# Patient Record
Sex: Male | Born: 1955 | Race: Black or African American | Hispanic: No | Marital: Married | Smoking: Never smoker
Health system: Southern US, Community
[De-identification: ages and names within clinical notes are randomized; demographics above are authoritative.]

## PROBLEM LIST (undated history)

## (undated) DIAGNOSIS — K219 Gastro-esophageal reflux disease without esophagitis: Secondary | ICD-10-CM

## (undated) DIAGNOSIS — I10 Essential (primary) hypertension: Secondary | ICD-10-CM

## (undated) DIAGNOSIS — M1990 Unspecified osteoarthritis, unspecified site: Secondary | ICD-10-CM

## (undated) HISTORY — PX: REPAIR ANKLE LIGAMENT: SUR1187

---

## 2018-12-06 ENCOUNTER — Other Ambulatory Visit: Payer: Self-pay | Admitting: Orthopaedic Surgery

## 2019-01-08 NOTE — Patient Instructions (Signed)
Brett Buchanan  01/08/2019   Your procedure is scheduled on: 01-16-19    Report to [facility] Main  Entrance    Report to SHORT STAY at 5:30 AM    Call this number if you have problems the morning of surgery [phone]    Remember: Do not eat food or drink liquids :After Midnight.    BRUSH YOUR TEETH MORNING OF SURGERY AND RINSE YOUR MOUTH OUT, NO CHEWING GUM CANDY OR MINTS.     Take these medicines the morning of surgery with A SIP OF WATER: Amlodipine (Norvasc), and Pantoprazole (Protonix)                                You may not have any metal on your body including hair pins and              piercings  Do not wear jewelry, cologne, lotions, powders or perfumes, deodorant             Men may shave face and neck.   Do not bring valuables to the hospital. [facility] IS NOT             RESPONSIBLE   FOR VALUABLES.  Contacts, dentures or bridgework may not be worn into surgery.  Leave suitcase in the car. After surgery it may be brought to your room.     Patients discharged the day of surgery will not be allowed to drive home. IF YOU ARE HAVING SURGERY AND GOING HOME THE SAME DAY, YOU MUST HAVE AN ADULT TO DRIVE YOU HOME AND BE WITH YOU FOR 24 HOURS. YOU MAY GO HOME BY TAXI OR UBER OR ORTHERWISE, BUT AN ADULT MUST ACCOMPANY YOU HOME AND STAY WITH YOU FOR 24 HOURS.    Special Instructions: N/A              Please read over the following fact sheets you were given: _____________________________________________________________________  [facility] - Preparing for Surgery         Before surgery, you can play an important role.  Because skin is not sterile, your skin needs to be as free of germs as possible.  You can reduce the number of germs on your skin by washing with CHG (chlorahexidine gluconate) soap before surgery.  CHG is an antiseptic cleaner which kills germs and bonds with the skin to continue killing germs even after washing. Please DO NOT use  if you have an allergy to CHG or antibacterial soaps.  If your skin becomes reddened/irritated stop using the CHG and inform your nurse when you arrive at Short Stay. Do not shave (including legs and underarms) for at least 48 hours prior to the first CHG shower.  You may shave your face/neck. Please follow these instructions carefully:  1.  Shower with CHG Soap the night before surgery and the  morning of Surgery.  2.  If you choose to wash your hair, wash your hair first as usual with your  normal  shampoo.  3.  After you shampoo, rinse your hair and body thoroughly to remove the  shampoo.                           4.  Use CHG as you would any other liquid soap.  You can apply chg  directly  to the skin and wash                       Gently with a scrungie or clean washcloth.  5.  Apply the CHG Soap to your body ONLY FROM THE NECK DOWN.   Do not use on face/ open                           Wound or open sores. Avoid contact with eyes, ears mouth and genitals (private parts).                       Wash face,  Genitals (private parts) with your normal soap.             6.  Wash thoroughly, paying special attention to the area where your surgery  will be performed.  7.  Thoroughly rinse your body with warm water from the neck down.  8.  DO NOT shower/wash with your normal soap after using and rinsing off  the CHG Soap.                9.  Pat yourself dry with a clean towel.            10.  Wear clean pajamas.            11.  Place clean sheets on your bed the night of your first shower and do not  sleep with pets. Day of Surgery : Do not apply any lotions/deodorants the morning of surgery.  Please wear clean clothes to the hospital/surgery center.  FAILURE TO FOLLOW THESE INSTRUCTIONS MAY RESULT IN THE CANCELLATION OF YOUR SURGERY PATIENT SIGNATURE_________________________________  NURSE  SIGNATURE__________________________________  ________________________________________________________________________   Brett Buchanan  An incentive spirometer is a tool that can help keep your lungs clear and active. This tool measures how well you are filling your lungs with each breath. Taking long deep breaths may help reverse or decrease the chance of developing breathing (pulmonary) problems (especially infection) following:  A long period of time when you are unable to move or be active. BEFORE THE PROCEDURE   If the spirometer includes an indicator to show your best effort, your nurse or respiratory therapist will set it to a desired goal.  If possible, sit up straight or lean slightly forward. Try not to slouch.  Hold the incentive spirometer in an upright position. INSTRUCTIONS FOR USE  1. Sit on the edge of your bed if possible, or sit up as far as you can in bed or on a chair. 2. Hold the incentive spirometer in an upright position. 3. Breathe out normally. 4. Place the mouthpiece in your mouth and seal your lips tightly around it. 5. Breathe in slowly and as deeply as possible, raising the piston or the ball toward the top of the column. 6. Hold your breath for 3-5 seconds or for as long as possible. Allow the piston or ball to fall to the bottom of the column. 7. Remove the mouthpiece from your mouth and breathe out normally. 8. Rest for a few seconds and repeat Steps 1 through 7 at least 10 times every 1-2 hours when you are awake. Take your time and take a few normal breaths between deep breaths. 9. The spirometer may include an indicator to show your best effort. Use the indicator as a goal to work toward during each repetition. 10.  After each set of 10 deep breaths, practice coughing to be sure your lungs are clear. If you have an incision (the cut made at the time of surgery), support your incision when coughing by placing a pillow or rolled up towels firmly  against it. Once you are able to get out of bed, walk around indoors and cough well. You may stop using the incentive spirometer when instructed by your caregiver.  RISKS AND COMPLICATIONS  Take your time so you do not get dizzy or light-headed.  If you are in pain, you may need to take or ask for pain medication before doing incentive spirometry. It is harder to take a deep breath if you are having pain. AFTER USE  Rest and breathe slowly and easily.  It can be helpful to keep track of a log of your progress. Your caregiver can provide you with a simple table to help with this. If you are using the spirometer at home, follow these instructions: [place] IF:   You are having difficultly using the spirometer.  You have trouble using the spirometer as often as instructed.  Your pain medication is not giving enough relief while using the spirometer.  You develop fever of 100.5 F (38.1 C) or higher. SEEK IMMEDIATE MEDICAL CARE IF:   You cough up bloody sputum that had not been present before.  You develop fever of 102 F (38.9 C) or greater.  You develop worsening pain at or near the incision site. MAKE SURE YOU:   Understand these instructions.  Will watch your condition.  Will get help right away if you are not doing well or get worse. Document Released: 02/28/2007 Document Revised: 01/10/2012 Document Reviewed: 05/01/2007 [employer] Patient Information 2014 [employer], [place].   ________________________________________________________________________  WHAT IS A BLOOD TRANSFUSION? Blood Transfusion Information  A transfusion is the replacement of blood or some of its parts. Blood is made up of multiple cells which provide different functions.  Red blood cells carry oxygen and are used for blood loss replacement.  White blood cells fight against infection.  Platelets control bleeding.  Plasma helps clot blood.  Other blood products are available for  specialized needs, such as hemophilia or other clotting disorders. BEFORE THE TRANSFUSION  Who gives blood for transfusions?   Healthy volunteers who are fully evaluated to make sure their blood is safe. This is blood bank blood. Transfusion therapy is the safest it has ever been in the practice of medicine. Before blood is taken from a donor, a complete history is taken to make sure that person has no history of diseases nor engages in risky social behavior (examples are intravenous drug use or sexual activity with multiple partners). The donor's travel history is screened to minimize risk of transmitting infections, such as malaria. The donated blood is tested for signs of infectious diseases, such as HIV and hepatitis. The blood is then tested to be sure it is compatible with you in order to minimize the chance of a transfusion reaction. If you or a relative donates blood, this is often done in anticipation of surgery and is not appropriate for emergency situations. It takes many days to process the donated blood. RISKS AND COMPLICATIONS Although transfusion therapy is very safe and saves many lives, the main dangers of transfusion include:   Getting an infectious disease.  Developing a transfusion reaction. This is an allergic reaction to something in the blood you were given. Every precaution is taken to prevent this. The decision  to have a blood transfusion has been considered carefully by your caregiver before blood is given. Blood is not given unless the benefits outweigh the risks. AFTER THE TRANSFUSION  Right after receiving a blood transfusion, you will usually feel much better and more energetic. This is especially true if your red blood cells have gotten low (anemic). The transfusion raises the level of the red blood cells which carry oxygen, and this usually causes an energy increase.  The nurse administering the transfusion will monitor you carefully for complications. HOME CARE  INSTRUCTIONS  No special instructions are needed after a transfusion. You may find your energy is better. Speak with your caregiver about any limitations on activity for underlying diseases you may have. SEEK MEDICAL CARE IF:   Your condition is not improving after your transfusion.  You develop redness or irritation at the intravenous (IV) site. SEEK IMMEDIATE MEDICAL CARE IF:  Any of the following symptoms occur over the next 12 hours:  Shaking chills.  You have a temperature by mouth above 102 F (38.9 C), not controlled by medicine.  Chest, back, or muscle pain.  People around you feel you are not acting correctly or are confused.  Shortness of breath or difficulty breathing.  Dizziness and fainting.  You get a rash or develop hives.  You have a decrease in urine output.  Your urine turns a dark color or changes to pink, red, or brown. Any of the following symptoms occur over the next 10 days:  You have a temperature by mouth above 102 F (38.9 C), not controlled by medicine.  Shortness of breath.  Weakness after normal activity.  The white part of the eye turns yellow (jaundice).  You have a decrease in the amount of urine or are urinating less often.  Your urine turns a dark color or changes to pink, red, or brown. Document Released: 10/15/2000 Document Revised: 01/10/2012 Document Reviewed: 06/03/2008 [facility] Patient Information 2014 [place], [place].  _______________________________________________________________________

## 2019-01-10 ENCOUNTER — Encounter (HOSPITAL_COMMUNITY)
Admission: RE | Admit: 2019-01-10 | Discharge: 2019-01-10 | Disposition: A | Discharge status: Home / Self Care | Payer: No Typology Code available for payment source | Source: Ambulatory Visit | Attending: Orthopaedic Surgery | Admitting: Orthopaedic Surgery

## 2019-01-10 ENCOUNTER — Encounter (HOSPITAL_COMMUNITY): Payer: Self-pay

## 2019-01-10 ENCOUNTER — Ambulatory Visit (HOSPITAL_COMMUNITY)
Admission: RE | Admit: 2019-01-10 | Discharge: 2019-01-10 | Disposition: A | Discharge status: Home / Self Care | Payer: No Typology Code available for payment source | Source: Ambulatory Visit | Attending: Orthopaedic Surgery | Admitting: Orthopaedic Surgery

## 2019-01-10 ENCOUNTER — Other Ambulatory Visit: Payer: Self-pay

## 2019-01-10 DIAGNOSIS — Z01818 Encounter for other preprocedural examination: Secondary | ICD-10-CM | POA: Insufficient documentation

## 2019-01-10 HISTORY — DX: Unspecified osteoarthritis, unspecified site: M19.90

## 2019-01-10 HISTORY — DX: Essential (primary) hypertension: I10

## 2019-01-10 HISTORY — DX: Gastro-esophageal reflux disease without esophagitis: K21.9

## 2019-01-10 LAB — CBC WITH DIFFERENTIAL/PLATELET
Abs Immature Granulocytes: 0.02 10*3/uL (ref 0.00–0.07)
Basophils Absolute: 0.1 10*3/uL (ref 0.0–0.1)
Basophils Relative: 2 %
Eosinophils Absolute: 0.4 10*3/uL (ref 0.0–0.5)
Eosinophils Relative: 6 %
HCT: 44.9 % (ref 39.0–52.0)
HEMOGLOBIN: 14.6 g/dL (ref 13.0–17.0)
Immature Granulocytes: 0 %
Lymphocytes Relative: 35 %
Lymphs Abs: 2.4 10*3/uL (ref 0.7–4.0)
MCH: 29 pg (ref 26.0–34.0)
MCHC: 32.5 g/dL (ref 30.0–36.0)
MCV: 89.3 fL (ref 80.0–100.0)
MONOS PCT: 12 %
Monocytes Absolute: 0.9 10*3/uL (ref 0.1–1.0)
Neutro Abs: 3.2 10*3/uL (ref 1.7–7.7)
Neutrophils Relative %: 45 %
Platelets: 294 10*3/uL (ref 150–400)
RBC: 5.03 MIL/uL (ref 4.22–5.81)
RDW: 12.9 % (ref 11.5–15.5)
WBC: 7 10*3/uL (ref 4.0–10.5)
nRBC: 0 % (ref 0.0–0.2)

## 2019-01-10 LAB — URINALYSIS, ROUTINE W REFLEX MICROSCOPIC
Bilirubin Urine: NEGATIVE
Glucose, UA: NEGATIVE mg/dL
Hgb urine dipstick: NEGATIVE
Ketones, ur: NEGATIVE mg/dL
Leukocytes,Ua: NEGATIVE
NITRITE: NEGATIVE
Protein, ur: NEGATIVE mg/dL
Specific Gravity, Urine: 1.008 (ref 1.005–1.030)
pH: 6 (ref 5.0–8.0)

## 2019-01-10 LAB — BASIC METABOLIC PANEL
Anion gap: 9 (ref 5–15)
BUN: 16 mg/dL (ref 8–23)
CO2: 25 mmol/L (ref 22–32)
Calcium: 9.2 mg/dL (ref 8.9–10.3)
Chloride: 103 mmol/L (ref 98–111)
Creatinine, Ser: 0.94 mg/dL (ref 0.61–1.24)
GFR calc Af Amer: >60 mL/min (ref >60)
GFR calc non Af Amer: >60 mL/min (ref >60)
Glucose, Bld: 90 mg/dL (ref 70–99)
Potassium: 3.8 mmol/L (ref 3.5–5.1)
Sodium: 137 mmol/L (ref 135–145)

## 2019-01-10 LAB — PROTIME-INR
INR: 1 (ref 0.8–1.2)
Prothrombin Time: 12.9 seconds (ref 11.4–15.2)

## 2019-01-10 LAB — SURGICAL PCR SCREEN
MRSA, PCR: NEGATIVE
STAPHYLOCOCCUS AUREUS: NEGATIVE

## 2019-01-10 LAB — APTT: aPTT: 30 seconds (ref 24–36)

## 2019-01-10 NOTE — H&P (Signed)
TOTAL KNEE ADMISSION H&P  Patient is being admitted for right total knee arthroplasty.  Subjective:  Chief Complaint:right knee pain.  HPI: Brett Buchanan, 63 y.o. male, has a history of pain and functional disability in the right knee due to arthritis and has failed non-surgical conservative treatments for greater than 12 weeks to includeNSAID's and/or analgesics, corticosteriod injections, flexibility and strengthening excercises, supervised PT with diminished ADL's post treatment, use of assistive devices and weight reduction as appropriate.  Onset of symptoms was gradual, starting 1 years ago with gradually worsening course since that time. The patient noted prior procedures on the knee to include  arthroscopy on the right knee(s).  Patient currently rates pain in the right knee(s) at 10 out of 10 with activity. Patient has night pain, worsening of pain with activity and weight bearing, pain that interferes with activities of daily living, crepitus and joint swelling.  Patient has evidence of subchondral cysts, subchondral sclerosis, periarticular osteophytes and joint space narrowing by imaging studies. There is no active infection.  There are no active problems to display for this patient.  No past medical history on file.   No current facility-administered medications for this encounter.    Current Outpatient Medications  Medication Sig Dispense Refill Last Dose  . amLODipine (NORVASC) 10 MG tablet Take 10 mg by mouth daily.     Marland Kitchen aspirin EC 81 MG tablet Take 81 mg by mouth daily.     . cholecalciferol (VITAMIN D3) 25 MCG (1000 UT) tablet Take 1,000 Units by mouth daily.     . hydrochlorothiazide (HYDRODIURIL) 25 MG tablet Take 25 mg by mouth daily.     . irbesartan (AVAPRO) 300 MG tablet Take 300 mg by mouth daily.     . pantoprazole (PROTONIX) 20 MG tablet Take 20 mg by mouth daily.      No Known Allergies  Social History   Tobacco Use  . Smoking status: Not on file  Substance Use  Topics  . Alcohol use: Not on file    No family history on file.   Review of Systems  Musculoskeletal: Positive for joint pain.       Right knee  All other systems reviewed and are negative.   Objective:  Physical Exam  Constitutional: He is oriented to person, place, and time. He appears well-developed and well-nourished.  HENT:  Head: Normocephalic and atraumatic.  Eyes: Pupils are equal, round, and reactive to light. Conjunctivae are normal.  Neck: Normal range of motion.  Cardiovascular: Normal rate and regular rhythm.  Respiratory: Effort normal.  GI: Soft.  Musculoskeletal:     Comments: Right knee motion is about 0-120.  He has medial joint line pain and crepitation.  His skin is benign.  Hip motion is full and straight leg raise is negative.  Sensation and motor function are intact in his feet with palpable pulses on both sides.   Neurological: He is alert and oriented to person, place, and time.  Skin: Skin is warm and dry.  Psychiatric: He has a normal mood and affect. His behavior is normal. Judgment and thought content normal.    Vital signs in last 24 hours: BP: ()/()  Arterial Line BP: ()/()   Labs:   There is no height or weight on file to calculate BMI.   Imaging Review Plain radiographs demonstrate severe degenerative joint disease of the right knee(s). The overall alignment isneutral. The bone quality appears to be good for age and reported activity level.  Assessment/Plan:  End stage arthritis, right knee   The patient history, physical examination, clinical judgment of the provider and imaging studies are consistent with end stage degenerative joint disease of the right knee(s) and total knee arthroplasty is deemed medically necessary. The treatment options including medical management, injection therapy arthroscopy and arthroplasty were discussed at length. The risks and benefits of total knee arthroplasty were presented and reviewed. The  risks due to aseptic loosening, infection, stiffness, patella tracking problems, thromboembolic complications and other imponderables were discussed. The patient acknowledged the explanation, agreed to proceed with the plan and consent was signed. Patient is being admitted for inpatient treatment for surgery, pain control, PT, OT, prophylactic antibiotics, VTE prophylaxis, progressive ambulation and ADL's and discharge planning. The patient is planning to be discharged home with home health services  Patient's anticipated LOS is less than 2 midnights, meeting these requirements: - Younger than 19 - Lives within 1 hour of care - Has a competent adult at home to recover with post-op recover - NO history of  - Chronic pain requiring opiods  - Diabetes  - Coronary Artery Disease  - Heart failure  - Heart attack  - Stroke  - DVT/VTE  - Cardiac arrhythmia  - Respiratory Failure/COPD  - Renal failure  - Anemia  - Advanced Liver disease

## 2019-01-11 LAB — ABO/RH: ABO/RH(D): O POS

## 2019-01-15 MED ORDER — BUPIVACAINE LIPOSOME 1.3 % IJ SUSP
20.0000 mL | Freq: Once | INTRAMUSCULAR | Status: DC
  Filled 2019-01-15: qty 20

## 2019-01-15 MED ORDER — TRANEXAMIC ACID 1000 MG/10ML IV SOLN
2000.0000 mg | INTRAVENOUS | Status: DC
  Filled 2019-01-15: qty 20

## 2019-01-15 NOTE — Anesthesia Preprocedure Evaluation (Addendum)
Anesthesia Evaluation  Patient identified by MRN, date of birth, ID band Patient awake    Reviewed: Allergy & Precautions, NPO status , Patient's Chart, lab work & pertinent test results  Airway Mallampati: I  TM Distance: >3 FB Neck ROM: Full    Dental no notable dental hx. (+) Teeth Intact, Dental Advisory Given   Pulmonary neg pulmonary ROS,    Pulmonary exam normal breath sounds clear to auscultation       Cardiovascular hypertension, Pt. on medications negative cardio ROS Normal cardiovascular exam Rhythm:Regular Rate:Normal     Neuro/Psych negative neurological ROS  negative psych ROS   GI/Hepatic Neg liver ROS, GERD  Medicated,  Endo/Other  negative endocrine ROS  Renal/GU negative Renal ROS  negative genitourinary   Musculoskeletal  (+) Arthritis , Osteoarthritis,    Abdominal   Peds  Hematology negative hematology ROS (+)   Anesthesia Other Findings   Reproductive/Obstetrics                            Anesthesia Physical Anesthesia Plan  ASA: II  Anesthesia Plan: Spinal and Regional   Post-op Pain Management:  Regional for Post-op pain   Induction:   PONV Risk Score and Plan: Treatment may vary due to age or medical condition, Midazolam, Ondansetron and Dexamethasone  Airway Management Planned: Natural Airway and Simple Face Mask  Additional Equipment:   Intra-op Plan:   Post-operative Plan:   Informed Consent: I have reviewed the patients History and Physical, chart, labs and discussed the procedure including the risks, benefits and alternatives for the proposed anesthesia with the patient or authorized representative who has indicated his/her understanding and acceptance.     Dental advisory given  Plan Discussed with: CRNA  Anesthesia Plan Comments:         Anesthesia Quick Evaluation

## 2019-01-16 ENCOUNTER — Observation Stay (HOSPITAL_COMMUNITY)
Admission: RE | Admit: 2019-01-16 | Discharge: 2019-01-17 | Disposition: A | Discharge status: Home / Self Care | Payer: No Typology Code available for payment source | Attending: Orthopaedic Surgery | Admitting: Orthopaedic Surgery

## 2019-01-16 ENCOUNTER — Encounter (HOSPITAL_COMMUNITY)
Admission: RE | Disposition: A | Discharge status: Home / Self Care | Payer: Self-pay | Source: Home / Self Care | Attending: Orthopaedic Surgery

## 2019-01-16 ENCOUNTER — Ambulatory Visit (HOSPITAL_COMMUNITY): Payer: No Typology Code available for payment source | Admitting: Physician Assistant

## 2019-01-16 ENCOUNTER — Other Ambulatory Visit: Payer: Self-pay

## 2019-01-16 ENCOUNTER — Ambulatory Visit (HOSPITAL_COMMUNITY): Payer: No Typology Code available for payment source | Admitting: Certified Registered Nurse Anesthetist

## 2019-01-16 ENCOUNTER — Encounter (HOSPITAL_COMMUNITY): Payer: Self-pay | Admitting: *Deleted

## 2019-01-16 DIAGNOSIS — Z7982 Long term (current) use of aspirin: Secondary | ICD-10-CM | POA: Diagnosis not present

## 2019-01-16 DIAGNOSIS — I10 Essential (primary) hypertension: Secondary | ICD-10-CM | POA: Diagnosis not present

## 2019-01-16 DIAGNOSIS — M1711 Unilateral primary osteoarthritis, right knee: Secondary | ICD-10-CM | POA: Diagnosis present

## 2019-01-16 DIAGNOSIS — K219 Gastro-esophageal reflux disease without esophagitis: Secondary | ICD-10-CM | POA: Diagnosis not present

## 2019-01-16 DIAGNOSIS — R262 Difficulty in walking, not elsewhere classified: Secondary | ICD-10-CM | POA: Insufficient documentation

## 2019-01-16 DIAGNOSIS — Z79899 Other long term (current) drug therapy: Secondary | ICD-10-CM | POA: Diagnosis not present

## 2019-01-16 HISTORY — PX: TOTAL KNEE ARTHROPLASTY: SHX125

## 2019-01-16 LAB — TYPE AND SCREEN
ABO/RH(D): O POS
Antibody Screen: NEGATIVE

## 2019-01-16 SURGERY — ARTHROPLASTY, KNEE, TOTAL
Anesthesia: Regional | Site: Knee | Laterality: Right

## 2019-01-16 MED ORDER — MENTHOL 3 MG MT LOZG: 1.0000 | LOZENGE | OROMUCOSAL | Status: DC | PRN

## 2019-01-16 MED ORDER — LACTATED RINGERS IV SOLN
INTRAVENOUS | Status: DC
  Administered 2019-01-16 (×2): via INTRAVENOUS

## 2019-01-16 MED ORDER — DOCUSATE SODIUM 100 MG PO CAPS
100.0000 mg | ORAL_CAPSULE | Freq: Two times a day (BID) | ORAL | Status: DC
  Administered 2019-01-16 – 2019-01-17 (×2): 100 mg via ORAL
  Filled 2019-01-16 (×2): qty 1

## 2019-01-16 MED ORDER — HYDROCHLOROTHIAZIDE 25 MG PO TABS
25.0000 mg | ORAL_TABLET | Freq: Every day | ORAL | Status: DC
  Administered 2019-01-17: 25 mg via ORAL
  Filled 2019-01-16: qty 1

## 2019-01-16 MED ORDER — HYDROCODONE-ACETAMINOPHEN 5-325 MG PO TABS
1.0000 | ORAL_TABLET | ORAL | Status: DC | PRN
  Administered 2019-01-16 (×2): 1 via ORAL
  Administered 2019-01-17: 2 via ORAL
  Filled 2019-01-16 (×2): qty 1
  Filled 2019-01-16: qty 2

## 2019-01-16 MED ORDER — DIPHENHYDRAMINE HCL 12.5 MG/5ML PO ELIX: 12.5000 mg | ORAL_SOLUTION | ORAL | Status: DC | PRN

## 2019-01-16 MED ORDER — LACTATED RINGERS IV SOLN
INTRAVENOUS | Status: DC
  Administered 2019-01-16: 22:00:00 via INTRAVENOUS

## 2019-01-16 MED ORDER — BUPIVACAINE-EPINEPHRINE (PF) 0.5% -1:200000 IJ SOLN
INTRAMUSCULAR | Status: DC | PRN
  Administered 2019-01-16: 20 mL via PERINEURAL

## 2019-01-16 MED ORDER — IRBESARTAN 150 MG PO TABS
300.0000 mg | ORAL_TABLET | Freq: Every day | ORAL | Status: DC
  Administered 2019-01-16 – 2019-01-17 (×2): 300 mg via ORAL
  Filled 2019-01-16 (×2): qty 2

## 2019-01-16 MED ORDER — BUPIVACAINE IN DEXTROSE 0.75-8.25 % IT SOLN
INTRATHECAL | Status: DC | PRN
  Administered 2019-01-16: 1.6 mL via INTRATHECAL

## 2019-01-16 MED ORDER — ASPIRIN EC 325 MG PO TBEC
325.0000 mg | DELAYED_RELEASE_TABLET | Freq: Two times a day (BID) | ORAL | Status: DC
  Administered 2019-01-17: 325 mg via ORAL
  Filled 2019-01-16: qty 1

## 2019-01-16 MED ORDER — CHLORHEXIDINE GLUCONATE 4 % EX LIQD: 60.0000 mL | Freq: Once | CUTANEOUS | Status: DC

## 2019-01-16 MED ORDER — FENTANYL CITRATE (PF) 100 MCG/2ML IJ SOLN
INTRAMUSCULAR | Status: DC | PRN
  Administered 2019-01-16 (×2): 50 ug via INTRAVENOUS

## 2019-01-16 MED ORDER — MIDAZOLAM HCL 5 MG/5ML IJ SOLN
INTRAMUSCULAR | Status: DC | PRN
  Administered 2019-01-16: 2 mg via INTRAVENOUS

## 2019-01-16 MED ORDER — METOCLOPRAMIDE HCL 5 MG PO TABS: 5.0000 mg | ORAL_TABLET | Freq: Three times a day (TID) | ORAL | Status: DC | PRN

## 2019-01-16 MED ORDER — ACETAMINOPHEN 500 MG PO TABS
1000.0000 mg | ORAL_TABLET | Freq: Once | ORAL | Status: AC
  Administered 2019-01-16: 1000 mg via ORAL
  Filled 2019-01-16: qty 2

## 2019-01-16 MED ORDER — TRANEXAMIC ACID 1000 MG/10ML IV SOLN
INTRAVENOUS | Status: DC | PRN
  Administered 2019-01-16: 2000 mg via TOPICAL

## 2019-01-16 MED ORDER — MIDAZOLAM HCL 2 MG/2ML IJ SOLN
INTRAMUSCULAR | Status: AC
  Filled 2019-01-16: qty 2

## 2019-01-16 MED ORDER — BUPIVACAINE-EPINEPHRINE (PF) 0.25% -1:200000 IJ SOLN
INTRAMUSCULAR | Status: DC | PRN
  Administered 2019-01-16: 30 mL

## 2019-01-16 MED ORDER — SODIUM CHLORIDE (PF) 0.9 % IJ SOLN
INTRAMUSCULAR | Status: AC
  Filled 2019-01-16: qty 50

## 2019-01-16 MED ORDER — FENTANYL CITRATE (PF) 100 MCG/2ML IJ SOLN
INTRAMUSCULAR | Status: AC
  Filled 2019-01-16: qty 2

## 2019-01-16 MED ORDER — CEFAZOLIN SODIUM-DEXTROSE 2-4 GM/100ML-% IV SOLN
2.0000 g | Freq: Four times a day (QID) | INTRAVENOUS | Status: AC
  Administered 2019-01-16 (×2): 2 g via INTRAVENOUS
  Filled 2019-01-16 (×2): qty 100

## 2019-01-16 MED ORDER — CLONIDINE HCL (ANALGESIA) 100 MCG/ML EP SOLN
EPIDURAL | Status: DC | PRN
  Administered 2019-01-16: 50 ug

## 2019-01-16 MED ORDER — BISACODYL 5 MG PO TBEC: 5.0000 mg | DELAYED_RELEASE_TABLET | Freq: Every day | ORAL | Status: DC | PRN

## 2019-01-16 MED ORDER — CEFAZOLIN SODIUM-DEXTROSE 2-4 GM/100ML-% IV SOLN
2.0000 g | INTRAVENOUS | Status: AC
  Administered 2019-01-16: 2 g via INTRAVENOUS
  Filled 2019-01-16: qty 100

## 2019-01-16 MED ORDER — ACETAMINOPHEN 325 MG PO TABS: 325.0000 mg | ORAL_TABLET | Freq: Four times a day (QID) | ORAL | Status: DC | PRN

## 2019-01-16 MED ORDER — ACETAMINOPHEN 500 MG PO TABS
500.0000 mg | ORAL_TABLET | Freq: Four times a day (QID) | ORAL | Status: AC
  Administered 2019-01-16 – 2019-01-17 (×4): 500 mg via ORAL
  Filled 2019-01-16 (×4): qty 1

## 2019-01-16 MED ORDER — SODIUM CHLORIDE 0.9 % IR SOLN
Status: DC | PRN
  Administered 2019-01-16: 3000 mL

## 2019-01-16 MED ORDER — ONDANSETRON HCL 4 MG/2ML IJ SOLN
INTRAMUSCULAR | Status: AC
  Filled 2019-01-16: qty 2

## 2019-01-16 MED ORDER — SODIUM CHLORIDE 0.9 % IR SOLN
Status: DC | PRN
  Administered 2019-01-16: 1000 mL

## 2019-01-16 MED ORDER — AMLODIPINE BESYLATE 10 MG PO TABS
10.0000 mg | ORAL_TABLET | Freq: Every day | ORAL | Status: DC
  Administered 2019-01-17: 10 mg via ORAL
  Filled 2019-01-16: qty 1

## 2019-01-16 MED ORDER — FENTANYL CITRATE (PF) 100 MCG/2ML IJ SOLN: 25.0000 ug | INTRAMUSCULAR | Status: DC | PRN

## 2019-01-16 MED ORDER — PROPOFOL 10 MG/ML IV BOLUS
INTRAVENOUS | Status: AC
  Filled 2019-01-16: qty 60

## 2019-01-16 MED ORDER — ALUM & MAG HYDROXIDE-SIMETH 200-200-20 MG/5ML PO SUSP: 30.0000 mL | ORAL | Status: DC | PRN

## 2019-01-16 MED ORDER — BUPIVACAINE-EPINEPHRINE (PF) 0.25% -1:200000 IJ SOLN
INTRAMUSCULAR | Status: AC
  Filled 2019-01-16: qty 30

## 2019-01-16 MED ORDER — METHOCARBAMOL 500 MG PO TABS
500.0000 mg | ORAL_TABLET | Freq: Four times a day (QID) | ORAL | Status: DC | PRN
  Administered 2019-01-16 (×2): 500 mg via ORAL
  Filled 2019-01-16 (×2): qty 1

## 2019-01-16 MED ORDER — PROPOFOL 10 MG/ML IV BOLUS
INTRAVENOUS | Status: DC | PRN
  Administered 2019-01-16: 10 mg via INTRAVENOUS

## 2019-01-16 MED ORDER — BUPIVACAINE LIPOSOME 1.3 % IJ SUSP
INTRAMUSCULAR | Status: DC | PRN
  Administered 2019-01-16: 20 mL

## 2019-01-16 MED ORDER — BUPIVACAINE HCL (PF) 0.5 % IJ SOLN
INTRAMUSCULAR | Status: AC
  Filled 2019-01-16: qty 30

## 2019-01-16 MED ORDER — ONDANSETRON HCL 4 MG/2ML IJ SOLN
INTRAMUSCULAR | Status: DC | PRN
  Administered 2019-01-16: 4 mg via INTRAVENOUS

## 2019-01-16 MED ORDER — TRANEXAMIC ACID-NACL 1000-0.7 MG/100ML-% IV SOLN
1000.0000 mg | INTRAVENOUS | Status: AC
  Administered 2019-01-16: 1000 mg via INTRAVENOUS
  Filled 2019-01-16: qty 100

## 2019-01-16 MED ORDER — ONDANSETRON HCL 4 MG/2ML IJ SOLN: 4.0000 mg | Freq: Four times a day (QID) | INTRAMUSCULAR | Status: DC | PRN

## 2019-01-16 MED ORDER — METHOCARBAMOL 500 MG IVPB - SIMPLE MED
500.0000 mg | Freq: Four times a day (QID) | INTRAVENOUS | Status: DC | PRN
  Filled 2019-01-16: qty 50

## 2019-01-16 MED ORDER — TRANEXAMIC ACID-NACL 1000-0.7 MG/100ML-% IV SOLN
1000.0000 mg | Freq: Once | INTRAVENOUS | Status: AC
  Administered 2019-01-16: 1000 mg via INTRAVENOUS
  Filled 2019-01-16: qty 100

## 2019-01-16 MED ORDER — PHENOL 1.4 % MT LIQD
1.0000 | OROMUCOSAL | Status: DC | PRN
  Filled 2019-01-16: qty 177

## 2019-01-16 MED ORDER — SODIUM CHLORIDE 0.9 % IJ SOLN
INTRAMUSCULAR | Status: DC | PRN
  Administered 2019-01-16: 30 mL

## 2019-01-16 MED ORDER — KETOROLAC TROMETHAMINE 15 MG/ML IJ SOLN
15.0000 mg | Freq: Four times a day (QID) | INTRAMUSCULAR | Status: AC
  Administered 2019-01-16 – 2019-01-17 (×4): 15 mg via INTRAVENOUS
  Filled 2019-01-16 (×4): qty 1

## 2019-01-16 MED ORDER — MORPHINE SULFATE (PF) 2 MG/ML IV SOLN: 0.5000 mg | INTRAVENOUS | Status: DC | PRN

## 2019-01-16 MED ORDER — STERILE WATER FOR IRRIGATION IR SOLN
Status: DC | PRN
  Administered 2019-01-16: 2000 mL

## 2019-01-16 MED ORDER — METOCLOPRAMIDE HCL 5 MG/ML IJ SOLN: 5.0000 mg | Freq: Three times a day (TID) | INTRAMUSCULAR | Status: DC | PRN

## 2019-01-16 MED ORDER — HYDROCODONE-ACETAMINOPHEN 7.5-325 MG PO TABS: 1.0000 | ORAL_TABLET | ORAL | Status: DC | PRN

## 2019-01-16 MED ORDER — PROPOFOL 10 MG/ML IV BOLUS
INTRAVENOUS | Status: AC
  Filled 2019-01-16: qty 20

## 2019-01-16 MED ORDER — PANTOPRAZOLE SODIUM 20 MG PO TBEC
20.0000 mg | DELAYED_RELEASE_TABLET | Freq: Every day | ORAL | Status: DC
  Administered 2019-01-17: 20 mg via ORAL
  Filled 2019-01-16: qty 1

## 2019-01-16 MED ORDER — PROPOFOL 500 MG/50ML IV EMUL
INTRAVENOUS | Status: DC | PRN
  Administered 2019-01-16: 75 ug/kg/min via INTRAVENOUS

## 2019-01-16 MED ORDER — ONDANSETRON HCL 4 MG PO TABS: 4.0000 mg | ORAL_TABLET | Freq: Four times a day (QID) | ORAL | Status: DC | PRN

## 2019-01-16 SURGICAL SUPPLY — 59 items
ATTUNE MED DOME PAT 38 KNEE (Knees) ×2 IMPLANT
ATTUNE MED DOME PAT 38MM KNEE (Knees) ×1 IMPLANT
ATTUNE PS FEM RT SZ 6 CEM KNEE (Femur) ×3 IMPLANT
ATTUNE PSRP INSR SZ6 5 KNEE (Insert) ×2 IMPLANT
ATTUNE PSRP INSR SZ6 5MM KNEE (Insert) ×1 IMPLANT
BAG DECANTER FOR FLEXI CONT (MISCELLANEOUS) ×3 IMPLANT
BAG ZIPLOCK 12X15 (MISCELLANEOUS) ×3 IMPLANT
BANDAGE ACE 6X5 VEL STRL LF (GAUZE/BANDAGES/DRESSINGS) ×3 IMPLANT
BASE TIBIAL ROT PLAT SZ 7 KNEE (Knees) ×1 IMPLANT
BLADE SAGITTAL 25.0X1.19X90 (BLADE) ×2 IMPLANT
BLADE SAGITTAL 25.0X1.19X90MM (BLADE) ×1
BLADE SAW SGTL 13.0X1.19X90.0M (BLADE) ×3 IMPLANT
BOOTIES KNEE HIGH SLOAN (MISCELLANEOUS) ×3 IMPLANT
BOWL SMART MIX CTS (DISPOSABLE) ×3 IMPLANT
CEMENT HV SMART SET (Cement) ×6 IMPLANT
CHLORAPREP W/TINT 26ML (MISCELLANEOUS) ×6 IMPLANT
COVER SURGICAL LIGHT HANDLE (MISCELLANEOUS) ×3 IMPLANT
COVER WAND RF STERILE (DRAPES) IMPLANT
CUFF TOURN SGL QUICK 34 (TOURNIQUET CUFF) ×2
CUFF TRNQT CYL 34X4.125X (TOURNIQUET CUFF) ×1 IMPLANT
DECANTER SPIKE VIAL GLASS SM (MISCELLANEOUS) ×6 IMPLANT
DRAPE SHEET LG 3/4 BI-LAMINATE (DRAPES) ×3 IMPLANT
DRAPE TOP 10253 STERILE (DRAPES) ×3 IMPLANT
DRAPE U-SHAPE 47X51 STRL (DRAPES) ×6 IMPLANT
DRSG AQUACEL AG ADV 3.5X10 (GAUZE/BANDAGES/DRESSINGS) ×3 IMPLANT
ELECT REM PT RETURN 15FT ADLT (MISCELLANEOUS) ×3 IMPLANT
GLOVE BIO SURGEON STRL SZ8 (GLOVE) ×6 IMPLANT
GLOVE BIOGEL PI IND STRL 7.0 (GLOVE) ×3 IMPLANT
GLOVE BIOGEL PI IND STRL 8 (GLOVE) ×2 IMPLANT
GLOVE BIOGEL PI IND STRL 9 (GLOVE) ×1 IMPLANT
GLOVE BIOGEL PI INDICATOR 7.0 (GLOVE) ×6
GLOVE BIOGEL PI INDICATOR 8 (GLOVE) ×4
GLOVE BIOGEL PI INDICATOR 9 (GLOVE) ×2
GLOVE ECLIPSE 8.5 STRL (GLOVE) ×3 IMPLANT
GOWN SPEC L3 XXLG W/TWL (GOWN DISPOSABLE) ×3 IMPLANT
GOWN SPEC L4 XLG W/TWL (GOWN DISPOSABLE) ×3 IMPLANT
GOWN STRL REUS W/TWL XL LVL3 (GOWN DISPOSABLE) ×6 IMPLANT
HANDPIECE INTERPULSE COAX TIP (DISPOSABLE) ×2
HOLDER FOLEY CATH W/STRAP (MISCELLANEOUS) ×3 IMPLANT
HOOD PEEL AWAY FLYTE STAYCOOL (MISCELLANEOUS) ×9 IMPLANT
KIT TURNOVER KIT A (KITS) ×3 IMPLANT
MANIFOLD NEPTUNE II (INSTRUMENTS) ×3 IMPLANT
NS IRRIG 1000ML POUR BTL (IV SOLUTION) ×3 IMPLANT
PACK TOTAL KNEE CUSTOM (KITS) ×3 IMPLANT
PAD ARMBOARD 7.5X6 YLW CONV (MISCELLANEOUS) ×3 IMPLANT
PIN STEINMAN FIXATION KNEE (PIN) ×3 IMPLANT
PIN THREADED HEADED SIGMA (PIN) ×3 IMPLANT
PROTECTOR NERVE ULNAR (MISCELLANEOUS) ×3 IMPLANT
SET HNDPC FAN SPRY TIP SCT (DISPOSABLE) ×1 IMPLANT
SUT VIC AB 0 CT1 36 (SUTURE) ×3 IMPLANT
SUT VIC AB 2-0 CT1 27 (SUTURE) ×2
SUT VIC AB 2-0 CT1 TAPERPNT 27 (SUTURE) ×1 IMPLANT
SUT VIC AB 3-0 CT1 27 (SUTURE) ×2
SUT VIC AB 3-0 CT1 TAPERPNT 27 (SUTURE) ×1 IMPLANT
SUT VLOC 180 0 24IN GS25 (SUTURE) ×6 IMPLANT
TIBIAL BASE ROT PLAT SZ 7 KNEE (Knees) ×3 IMPLANT
TRAY FOLEY MTR SLVR 16FR STAT (SET/KITS/TRAYS/PACK) ×3 IMPLANT
WATER STERILE IRR 1000ML POUR (IV SOLUTION) ×3 IMPLANT
WRAP KNEE MAXI GEL POST OP (GAUZE/BANDAGES/DRESSINGS) ×3 IMPLANT

## 2019-01-16 NOTE — Transfer of Care (Signed)
Immediate Anesthesia Transfer of Care Note  Patient: Brett Buchanan  Procedure(s) Performed: TOTAL KNEE ARTHROPLASTY (Right Knee)  Patient Location: PACU  Anesthesia Type:MAC and Spinal  Level of Consciousness: awake, alert  and oriented  Airway & Oxygen Therapy: Patient Spontanous Breathing and Patient connected to face mask oxygen  Post-op Assessment: Report given to RN and Post -op Vital signs reviewed and stable  Post vital signs: Reviewed and stable  Last Vitals:  Vitals Value Taken Time  BP 115/79 01/16/2019  9:39 AM  Temp    Pulse 98 01/16/2019  9:41 AM  Resp 15 01/16/2019  9:41 AM  SpO2 98 % 01/16/2019  9:41 AM  Vitals shown include unvalidated device data.  Last Pain:  Vitals:   01/16/19 0545  TempSrc: Oral      Patients Stated Pain Goal: 4 (95/09/32 6712)  Complications: No apparent anesthesia complications

## 2019-01-16 NOTE — Evaluation (Signed)
Physical Therapy Evaluation Patient Details Name: Brett Buchanan MRN: [id number] DOB: Dec 22, 1955 Today's Date: 01/16/2019   History of Present Illness  s/p R TKA  Clinical Impression  Pt is s/p TKA resulting in the deficits listed below (see PT Problem List).  Pt amb ~ 42' with RW and min/guard. Anticipate steady progress, pt is hopeful to d/c tomorrow, has upstairs bedroom  Pt will benefit from skilled PT to increase their independence and safety with mobility to allow discharge to the venue listed below.      Follow Up Recommendations Follow surgeon's recommendation for DC plan and follow-up therapies    Equipment Recommendations  None recommended by PT    Recommendations for Other Services       Precautions / Restrictions Precautions Precautions: Knee Restrictions Weight Bearing Restrictions: No Other Position/Activity Restrictions: WBAT      Mobility  Bed Mobility Overal bed mobility: Needs Assistance Bed Mobility: Supine to Sit     Supine to sit: Min guard     General bed mobility comments: incr time, min/guard for safety  Transfers Overall transfer level: Needs assistance Equipment used: Rolling walker (2 wheeled) Transfers: Sit to/from Stand Sit to Stand: Min assist         General transfer comment: cues for hand placement and RLE position  Ambulation/Gait Ambulation/Gait assistance: Min assist;Min guard Gait Distance (Feet): 80 Feet Assistive device: Rolling walker (2 wheeled) Gait Pattern/deviations: Step-to pattern;Decreased stance time - right     General Gait Details: cues for sequence, RW position  Stairs            Wheelchair Mobility    Modified Rankin (Stroke Patients Only)       Balance                                             Pertinent Vitals/Pain Pain Assessment: 0-10 Pain Score: 4  Pain Location: right knee Pain Descriptors / Indicators: Sore Pain Intervention(s): Monitored during  session;Limited activity within patient's tolerance    Home Living Family/patient expects to be discharged to:: Private residence Living Arrangements: Spouse/significant other Available Help at Discharge: Family Type of Home: House Home Access: Level entry     Home Layout: Two level;Bed/bath upstairs Home Equipment: Environmental consultant - 2 wheels;Cane - single point;Shower seat      Prior Function Level of Independence: Independent               Hand Dominance        Extremity/Trunk Assessment   Upper Extremity Assessment Upper Extremity Assessment: Overall WFL for tasks assessed    Lower Extremity Assessment Lower Extremity Assessment: RLE deficits/detail RLE Deficits / Details: ankle WFL; knee extension and hip flexion 2+/5,limited by anticipated post op pain and weakness; AAROM grossly 10* to 60* flexion       Communication   Communication: No difficulties  Cognition Arousal/Alertness: Awake/alert Behavior During Therapy: WFL for tasks assessed/performed Overall Cognitive Status: Within Functional Limits for tasks assessed                                        General Comments      Exercises Total Joint Exercises Ankle Circles/Pumps: AROM;10 reps;Both Quad Sets: 5 reps;AROM;Both   Assessment/Plan    PT Assessment Patient needs continued PT services  PT Problem List Decreased strength;Decreased range of motion;Decreased mobility;Decreased knowledge of precautions;Decreased knowledge of use of DME       PT Treatment Interventions DME instruction;Gait training;Stair training;Functional mobility training;Therapeutic exercise;Patient/family education    PT Goals (Current goals can be found in the Care Plan section)  Acute Rehab PT Goals Patient Stated Goal: less knee pain PT Goal Formulation: With patient Time For Goal Achievement: 01/23/19 Potential to Achieve Goals: Good    Frequency 7X/week   Barriers to discharge        Co-evaluation                AM-PAC PT "6 Clicks" Mobility  Outcome Measure Help needed turning from your back to your side while in a flat bed without using bedrails?: A Little Help needed moving from lying on your back to sitting on the side of a flat bed without using bedrails?: A Little Help needed moving to and from a bed to a chair (including a wheelchair)?: A Little Help needed standing up from a chair using your arms (e.g., wheelchair or bedside chair)?: A Little Help needed to walk in hospital room?: A Little Help needed climbing 3-5 steps with a railing? : A Little 6 Click Score: 18    End of Session Equipment Utilized During Treatment: Gait belt Activity Tolerance: Patient tolerated treatment well Patient left: in chair;with call bell/phone within reach;with chair alarm set;with family/visitor present Nurse Communication: Mobility status PT Visit Diagnosis: Difficulty in walking, not elsewhere classified (R26.2)    Time: 5364-6803 PT Time Calculation (min) (ACUTE ONLY): 15 min   Charges:   PT Evaluation $PT Eval Low Complexity: 1 Low          Drucilla Chalet, PT  Pager: [phone] Acute Rehab Dept [facility]): [phone]   01/16/2019   [facility] 01/16/2019, 2:38 PM

## 2019-01-16 NOTE — Interval H&P Note (Signed)
History and Physical Interval Note:  01/16/2019 7:25 AM  Brett Buchanan  has presented today for surgery, with the diagnosis of LEFT KNEE OSTEOARTHRITIS.  The various methods of treatment have been discussed with the patient and family. After consideration of risks, benefits and other options for treatment, the patient has consented to  Procedure(s): TOTAL KNEE ARTHROPLASTY (Right) as a surgical intervention.  The patient's history has been reviewed, patient examined, no change in status, stable for surgery.  I have reviewed the patient's chart and labs.  Questions were answered to the patient's satisfaction.     [name]

## 2019-01-16 NOTE — Op Note (Signed)
PREOP DIAGNOSIS: DJD RIGHT KNEE POSTOP DIAGNOSIS: same PROCEDURE: RIGHT TKR ANESTHESIA: Spinal and MAC ATTENDING SURGEON: Hessie Dibble ASSISTANT: Loni Dolly PA  INDICATIONS FOR PROCEDURE: Brett Buchanan is a 63 y.o. male who has struggled for a long time with pain due to degenerative arthritis of the right knee.  The patient has failed many conservative non-operative measures and at this point has pain which limits the ability to sleep and walk.  The patient is offered total knee replacement.  Informed operative consent was obtained after discussion of possible risks of anesthesia, infection, neurovascular injury, DVT, and death.  The importance of the post-operative rehabilitation protocol to optimize result was stressed extensively with the patient.  SUMMARY OF FINDINGS AND PROCEDURE:  Brett Buchanan was taken to the operative suite where under the above anesthesia a right knee replacement was performed.  There were advanced degenerative changes and the bone quality was excellent.  We used the DePuy Attune system and placed size 6 femur, 7 tibia, 38 mm all polyethylene patella, and a size 5 mm spacer.  Loni Dolly PA-C assisted throughout and was invaluable to the completion of the case in that he helped retract and maintain exposure while I placed components.  He also helped close thereby minimizing OR time.  The patient was admitted for appropriate post-op care to include perioperative antibiotics and mechanical and pharmacologic measures for DVT prophylaxis.  DESCRIPTION OF PROCEDURE:  Brett Buchanan was taken to the operative suite where the above anesthesia was applied.  The patient was positioned supine and prepped and draped in normal sterile fashion.  An appropriate time out was performed.  After the administration of kefzol pre-op antibiotic the leg was elevated and exsanguinated and a tourniquet inflated. A standard longitudinal incision was made on the anterior knee.  Dissection was carried down  to the extensor mechanism.  All appropriate anti-infective measures were used including the pre-operative antibiotic, betadine impregnated drape, and closed hooded exhaust systems for each member of the surgical team.  A medial parapatellar incision was made in the extensor mechanism and the knee cap flipped and the knee flexed.  Some residual meniscal tissues were removed along with any remaining ACL/PCL tissue.  A guide was placed on the tibia and a flat cut was made on it's superior surface.  An intramedullary guide was placed in the femur and was utilized to make anterior and posterior cuts creating an appropriate flexion gap.  A second intramedullary guide was placed in the femur to make a distal cut properly balancing the knee with an extension gap equal to the flexion gap.  The three bones sized to the above mentioned sizes and the appropriate guides were placed and utilized.  A trial reduction was done and the knee easily came to full extension and the patella tracked well on flexion.  The trial components were removed and all bones were cleaned with pulsatile lavage and then dried thoroughly.  Cement was mixed and was pressurized onto the bones followed by placement of the aforementioned components.  Excess cement was trimmed and pressure was held on the components until the cement had hardened.  The tourniquet was deflated and a small amount of bleeding was controlled with cautery and pressure.  The knee was irrigated thoroughly.  The extensor mechanism was re-approximated with V-loc suture in running fashion.  The knee was flexed and the repair was solid.  The subcutaneous tissues were re-approximated with #0 and #2-0 vicryl and the skin closed with a subcuticular stitch  and steristrips.  A sterile dressing was applied.  Intraoperative fluids, EBL, and tourniquet time can be obtained from anesthesia records.  DISPOSITION:  The patient was taken to recovery room in stable condition and admitted for  appropriate post-op care to include peri-operative antibiotic and DVT prophylaxis with mechanical and pharmacologic measures.  Brett Buchanan 01/16/2019, 9:12 AM

## 2019-01-16 NOTE — Anesthesia Procedure Notes (Addendum)
Spinal  Patient location during procedure: OR Start time: 01/16/2019 7:33 AM End time: 01/16/2019 7:43 AM Staffing Anesthesiologist: Elmer Picker, MD Performed: anesthesiologist  Preanesthetic Checklist Completed: patient identified, surgical consent, pre-op evaluation, timeout performed, IV checked, risks and benefits discussed and monitors and equipment checked Spinal Block Patient position: sitting Prep: site prepped and draped and DuraPrep Patient monitoring: cardiac monitor, continuous pulse ox and blood pressure Approach: midline Location: L3-4 Injection technique: single-shot Needle Needle type: Pencan  Needle gauge: 24 G Needle length: 9 cm Assessment Sensory level: T6 Additional Notes Functioning IV was confirmed and monitors were applied. Sterile prep and drape, including hand hygiene and sterile gloves were used. The patient was positioned and the spine was prepped. The skin was anesthetized with lidocaine.  Free flow of clear CSF was obtained prior to injecting local anesthetic into the CSF.  The spinal needle aspirated freely following injection.  The needle was carefully withdrawn.  The patient tolerated the procedure well.

## 2019-01-16 NOTE — Anesthesia Procedure Notes (Signed)
Anesthesia Regional Block: Adductor canal block   Pre-Anesthetic Checklist: ,, timeout performed, Correct Patient, Correct Site, Correct Laterality, Correct Procedure, Correct Position, site marked, Risks and benefits discussed,  Surgical consent,  Pre-op evaluation,  At surgeon's request and post-op pain management  Laterality: Right  Prep: Maximum Sterile Barrier Precautions used, chloraprep       Needles:  Injection technique: Single-shot  Needle Type: Echogenic Stimulator Needle     Needle Length: 9cm  Needle Gauge: 22     Additional Needles:   Procedures:,,,, ultrasound used (permanent image in chart),,,,  Narrative:  Start time: 01/16/2019 7:08 AM End time: 01/16/2019 7:18 AM Injection made incrementally with aspirations every 5 mL.  Performed by: Personally  Anesthesiologist: Elmer Picker, MD  Additional Notes: Monitors applied. No increased pain on injection. No increased resistance to injection. Injection made in 5cc increments. Good needle visualization. Patient tolerated procedure well.

## 2019-01-16 NOTE — Plan of Care (Signed)
  Problem: Education: Goal: Knowledge of the prescribed therapeutic regimen will improve Outcome: Progressing   Problem: Activity: Goal: Ability to avoid complications of mobility impairment will improve Outcome: Progressing   Problem: Clinical Measurements: Goal: Postoperative complications will be avoided or minimized Outcome: Progressing   Problem: Pain Management: Goal: Pain level will decrease with appropriate interventions Outcome: Progressing   Problem: Education: Goal: Knowledge of General Education information will improve Description Including pain rating scale, medication(s)/side effects and non-pharmacologic comfort measures Outcome: Progressing   Problem: Clinical Measurements: Goal: Will remain free from infection Outcome: Progressing

## 2019-01-16 NOTE — Anesthesia Procedure Notes (Signed)
Procedure Name: MAC Date/Time: 01/16/2019 7:30 AM Performed by: Maxwell Caul, CRNA Pre-anesthesia Checklist: Patient identified, Emergency Drugs available, Suction available and Patient being monitored Oxygen Delivery Method: Simple face mask

## 2019-01-17 ENCOUNTER — Encounter (HOSPITAL_COMMUNITY): Payer: Self-pay | Admitting: Orthopaedic Surgery

## 2019-01-17 DIAGNOSIS — M1711 Unilateral primary osteoarthritis, right knee: Secondary | ICD-10-CM | POA: Diagnosis not present

## 2019-01-17 MED ORDER — TIZANIDINE HCL 4 MG PO TABS: 4.0000 mg | ORAL_TABLET | Freq: Four times a day (QID) | ORAL | 1 refills | Status: DC | PRN

## 2019-01-17 MED ORDER — HYDROCODONE-ACETAMINOPHEN 5-325 MG PO TABS: 1.0000 | ORAL_TABLET | Freq: Four times a day (QID) | ORAL | 0 refills | Status: DC | PRN

## 2019-01-17 MED ORDER — ASPIRIN 325 MG PO TBEC: 325.0000 mg | DELAYED_RELEASE_TABLET | Freq: Two times a day (BID) | ORAL | 0 refills | Status: DC

## 2019-01-17 NOTE — Discharge Summary (Signed)
Patient ID: Brett Buchanan MRN: [id number] DOB/AGE: 01/29/1956 63 y.o.  Admit date: 01/16/2019 Discharge date: 01/17/2019  Admission Diagnoses:  Principal Problem:   Primary localized osteoarthritis of right knee Active Problems:   Primary osteoarthritis of right knee   Discharge Diagnoses:  Same  Past Medical History:  Diagnosis Date  . Arthritis   . GERD (gastroesophageal reflux disease)   . Hypertension     Surgeries: Procedure(s): TOTAL KNEE ARTHROPLASTY on 01/16/2019   Consultants:   Discharged Condition: Improved  Hospital Course: Brett Buchanan is an 63 y.o. male who was admitted 01/16/2019 for operative treatment ofPrimary localized osteoarthritis of right knee. Patient has severe unremitting pain that affects sleep, daily activities, and work/hobbies. After pre-op clearance the patient was taken to the operating room on 01/16/2019 and underwent  Procedure(s): TOTAL KNEE ARTHROPLASTY.    Patient was given perioperative antibiotics:  Anti-infectives (From admission, onward)   Start     Dose/Rate Route Frequency Ordered Stop   01/16/19 1400  ceFAZolin (ANCEF) IVPB 2g/100 mL premix     2 g 200 mL/hr over 30 Minutes Intravenous Every 6 hours 01/16/19 1052 01/16/19 2246   01/16/19 0600  ceFAZolin (ANCEF) IVPB 2g/100 mL premix     2 g 200 mL/hr over 30 Minutes Intravenous On call to O.R. 01/16/19 0535 01/16/19 0759       Patient was given sequential compression devices, early ambulation, and chemoprophylaxis to prevent DVT.  Patient benefited maximally from hospital stay and there were no complications.    Recent vital signs:  Patient Vitals for the past 24 hrs:  BP Temp Temp src Pulse Resp SpO2  01/17/19 0610 118/85 98.3 F (36.8 C) Oral 87 14 100 %  01/17/19 0159 116/71 98.1 F (36.7 C) Oral 81 16 99 %  01/16/19 2242 138/81 98.3 F (36.8 C) Oral 84 16 100 %  01/16/19 1757 119/70 98.1 F (36.7 C) Oral 75 16 100 %  01/16/19 1438 106/86 98 F (36.7 C) - 75 16 100 %   01/16/19 1305 114/90 97.8 F (36.6 C) Oral 76 14 100 %  01/16/19 1219 111/83 97.7 F (36.5 C) Oral 78 16 100 %  01/16/19 1047 121/86 98.2 F (36.8 C) Oral 78 - 99 %  01/16/19 1030 (!) 126/91 97.9 F (36.6 C) - 74 14 97 %  01/16/19 1015 114/88 - - 81 13 96 %  01/16/19 1000 113/77 - - 87 12 96 %  01/16/19 0945 112/84 - - 95 16 98 %  01/16/19 0939 115/79 98 F (36.7 C) - (!) 106 13 97 %     Recent laboratory studies: No results for input(s): WBC, HGB, HCT, PLT, NA, K, CL, CO2, BUN, CREATININE, GLUCOSE, INR, CALCIUM in the last 72 hours.  Invalid input(s): PT, 2   Discharge Medications:   Allergies as of 01/17/2019   No Known Allergies     Medication List    TAKE these medications   amLODipine 10 MG tablet Commonly known as:  NORVASC Take 10 mg by mouth daily.   aspirin 325 MG EC tablet Take 1 tablet (325 mg total) by mouth 2 (two) times daily after a meal. What changed:    medication strength  how much to take  when to take this   cholecalciferol 25 MCG (1000 UT) tablet Commonly known as:  VITAMIN D3 Take 1,000 Units by mouth daily.   hydrochlorothiazide 25 MG tablet Commonly known as:  HYDRODIURIL Take 25 mg by mouth daily.   HYDROcodone-acetaminophen  5-325 MG tablet Commonly known as:  NORCO/VICODIN Take 1-2 tablets by mouth every 6 (six) hours as needed for moderate pain (pain score 4-6).   irbesartan 300 MG tablet Commonly known as:  AVAPRO Take 300 mg by mouth daily.   pantoprazole 20 MG tablet Commonly known as:  PROTONIX Take 20 mg by mouth daily.   tiZANidine 4 MG tablet Commonly known as:  Zanaflex Take 1 tablet (4 mg total) by mouth every 6 (six) hours as needed.            Durable Medical Equipment  (From admission, onward)         Start     Ordered   01/16/19 1053  DME Walker rolling  Once    Question:  Patient needs a walker to treat with the following condition  Answer:  Primary osteoarthritis of right knee   01/16/19 1052    01/16/19 1053  DME 3 n 1  Once     01/16/19 1052   01/16/19 1053  DME Bedside commode  Once    Question:  Patient needs a bedside commode to treat with the following condition  Answer:  Primary osteoarthritis of right knee   01/16/19 1052          Diagnostic Studies: Dg Chest 2 View  Result Date: 01/11/2019 CLINICAL DATA:  Preoperative for knee surgery EXAM: CHEST - 2 VIEW COMPARISON:  None. FINDINGS: Normal heart size. Normal mediastinal contour. No pneumothorax. No pleural effusion. Lungs appear clear, with no acute consolidative airspace disease and no pulmonary edema. IMPRESSION: No active cardiopulmonary disease. Electronically Signed   By: Delbert PhenixJason A Poff M.D.   On: 01/11/2019 09:05    Disposition: Discharge disposition: 01-Home or Self Care       Discharge Instructions    Call MD / Call 911   Complete by:  As directed    If you experience chest pain or shortness of breath, CALL 911 and be transported to the hospital emergency room.  If you develope a fever above 101 F, pus (white drainage) or increased drainage or redness at the wound, or calf pain, call your surgeon's office.   Constipation Prevention   Complete by:  As directed    Drink plenty of fluids.  Prune juice may be helpful.  You may use a stool softener, such as Colace (over the counter) 100 mg twice a day.  Use MiraLax (over the counter) for constipation as needed.   Diet - low sodium heart healthy   Complete by:  As directed    Discharge instructions   Complete by:  As directed    .INSTRUCTIONS AFTER JOINT REPLACEMENT   Remove items at home which could result in a fall. This includes throw rugs or furniture in walking pathways ICE to the affected joint every three hours while awake for 30 minutes at a time, for at least the first 3-5 days, and then as needed for pain and swelling.  Continue to use ice for pain and swelling. You may notice swelling that will progress down to the foot and ankle.  This is normal  after surgery.  Elevate your leg when you are not up walking on it.   Continue to use the breathing machine you got in the hospital (incentive spirometer) which will help keep your temperature down.  It is common for your temperature to cycle up and down following surgery, especially at night when you are not up moving around and exerting yourself.  The breathing  machine keeps your lungs expanded and your temperature down.   DIET:  As you were doing prior to hospitalization, we recommend a well-balanced diet.  DRESSING / WOUND CARE / SHOWERING  You may shower 3 days after surgery, but keep the wounds dry during showering.  You may use an occlusive plastic wrap (Press'n Seal for example), NO SOAKING/SUBMERGING IN THE BATHTUB.  If the bandage gets wet, change with a clean dry gauze.  If the incision gets wet, pat the wound dry with a clean towel.  ACTIVITY  Increase activity slowly as tolerated, but follow the weight bearing instructions below.   No driving for 6 weeks or until further direction given by your physician.  You cannot drive while taking narcotics.  No lifting or carrying greater than 10 lbs. until further directed by your surgeon. Avoid periods of inactivity such as sitting longer than an hour when not asleep. This helps prevent blood clots.  You may return to work once you are authorized by your doctor.     WEIGHT BEARING   Weight bearing as tolerated with assist device (walker, cane, etc) as directed, use it as long as suggested by your surgeon or therapist, typically at least 4-6 weeks.   EXERCISES  Results after joint replacement surgery are often greatly improved when you follow the exercise, range of motion and muscle strengthening exercises prescribed by your doctor. Safety measures are also important to protect the joint from further injury. Any time any of these exercises cause you to have increased pain or swelling, decrease what you are doing until you are  comfortable again and then slowly increase them. If you have problems or questions, call your caregiver or physical therapist for advice.   Rehabilitation is important following a joint replacement. After just a few days of immobilization, the muscles of the leg can become weakened and shrink (atrophy).  These exercises are designed to build up the tone and strength of the thigh and leg muscles and to improve motion. Often times heat used for twenty to thirty minutes before working out will loosen up your tissues and help with improving the range of motion but do not use heat for the first two weeks following surgery (sometimes heat can increase post-operative swelling).   These exercises can be done on a training (exercise) mat, on the floor, on a table or on a bed. Use whatever works the best and is most comfortable for you.    Use music or television while you are exercising so that the exercises are a pleasant break in your day. This will make your life better with the exercises acting as a break in your routine that you can look forward to.   Perform all exercises about fifteen times, three times per day or as directed.  You should exercise both the operative leg and the other leg as well.   Exercises include:   Quad Sets - Tighten up the muscle on the front of the thigh (Quad) and hold for 5-10 seconds.   Straight Leg Raises - With your knee straight (if you were given a brace, keep it on), lift the leg to 60 degrees, hold for 3 seconds, and slowly lower the leg.  Perform this exercise against resistance later as your leg gets stronger.  Leg Slides: Lying on your back, slowly slide your foot toward your buttocks, bending your knee up off the floor (only go as far as is comfortable). Then slowly slide your foot back down until  your leg is flat on the floor again.  Angel Wings: Lying on your back spread your legs to the side as far apart as you can without causing discomfort.  Hamstring Strength:   Lying on your back, push your heel against the floor with your leg straight by tightening up the muscles of your buttocks.  Repeat, but this time bend your knee to a comfortable angle, and push your heel against the floor.  You may put a pillow under the heel to make it more comfortable if necessary.   A rehabilitation program following joint replacement surgery can speed recovery and prevent re-injury in the future due to weakened muscles. Contact your doctor or a physical therapist for more information on knee rehabilitation.    CONSTIPATION  Constipation is defined medically as fewer than three stools per week and severe constipation as less than one stool per week.  Even if you have a regular bowel pattern at home, your normal regimen is likely to be disrupted due to multiple reasons following surgery.  Combination of anesthesia, postoperative narcotics, change in appetite and fluid intake all can affect your bowels.   YOU MUST use at least one of the following options; they are listed in order of increasing strength to get the job done.  They are all available over the counter, and you may need to use some, POSSIBLY even all of these options:    Drink plenty of fluids (prune juice may be helpful) and high fiber foods Colace 100 mg by mouth twice a day  Senokot for constipation as directed and as needed Dulcolax (bisacodyl), take with full glass of water  Miralax (polyethylene glycol) once or twice a day as needed.  If you have tried all these things and are unable to have a bowel movement in the first 3-4 days after surgery call either your surgeon or your primary doctor.    If you experience loose stools or diarrhea, hold the medications until you stool forms back up.  If your symptoms do not get better within 1 week or if they get worse, check with your doctor.  If you experience "the worst abdominal pain ever" or develop nausea or vomiting, please contact the office immediately for further  recommendations for treatment.   ITCHING:  If you experience itching with your medications, try taking only a single pain pill, or even half a pain pill at a time.  You can also use Benadryl over the counter for itching or also to help with sleep.   TED HOSE STOCKINGS:  Use stockings on both legs until for at least 2 weeks or as directed by physician office. They may be removed at night for sleeping.  MEDICATIONS:  See your medication summary on the "After Visit Summary" that nursing will review with you.  You may have some home medications which will be placed on hold until you complete the course of blood thinner medication.  It is important for you to complete the blood thinner medication as prescribed.  PRECAUTIONS:  If you experience chest pain or shortness of breath - call 911 immediately for transfer to the hospital emergency department.   If you develop a fever greater that 101 F, purulent drainage from wound, increased redness or drainage from wound, foul odor from the wound/dressing, or calf pain - CONTACT YOUR SURGEON.  FOLLOW-UP APPOINTMENTS:  If you do not already have a post-op appointment, please call the office for an appointment to be seen by your surgeon.  Guidelines for how soon to be seen are listed in your "After Visit Summary", but are typically between 1-4 weeks after surgery.  OTHER INSTRUCTIONS:   Knee Replacement:  Do not place pillow under knee, focus on keeping the knee straight while resting. CPM instructions: 0-90 degrees, 2 hours in the morning, 2 hours in the afternoon, and 2 hours in the evening. Place foam block, curve side up under heel at all times except when in CPM or when walking.  DO NOT modify, tear, cut, or change the foam block in any way.  MAKE SURE YOU:  Understand these instructions.  Get help right away if you are not doing well or get worse.    Thank you for letting us be a part of your medical  care team.  It is a privilege we respect greatly.  We hope these instructions will help you stay on track for a fast and full recovery!   Increase activity slowly as tolerated   Complete by:  As directed       Follow-up Information    Marcene Corning, MD. Schedule an appointment as soon as possible for a visit in 2 weeks.   Specialty:  Orthopedic Surgery Contact information: [address] [place] [address] [phone]            Signed: Ginger Organ Rhilee Currin 01/17/2019, 8:38 AM

## 2019-01-17 NOTE — Progress Notes (Signed)
Subjective: 1 Day Post-Op Procedure(s) (LRB): TOTAL KNEE ARTHROPLASTY (Right)  Patient is feeling well.  He is hoping to go home after therapy today.  Activity level:  wbat  Diet tolerance:  ok Voiding:  Foley out this morning Patient reports pain as mild.    Objective: Vital signs in last 24 hours: Temp:  [97.7 F (36.5 C)-98.3 F (36.8 C)] 98.3 F (36.8 C) (03/18 0610) Pulse Rate:  [74-106] 87 (03/18 0610) Resp:  [12-16] 14 (03/18 0610) BP: (106-138)/(70-91) 118/85 (03/18 0610) SpO2:  [96 %-100 %] 100 % (03/18 0610)  Labs: No results for input(s): HGB in the last 72 hours. No results for input(s): WBC, RBC, HCT, PLT in the last 72 hours. No results for input(s): NA, K, CL, CO2, BUN, CREATININE, GLUCOSE, CALCIUM in the last 72 hours. No results for input(s): LABPT, INR in the last 72 hours.  Physical Exam:  Neurologically intact ABD soft Neurovascular intact Sensation intact distally Intact pulses distally Dorsiflexion/Plantar flexion intact Incision: dressing C/D/I and scant drainage No cellulitis present Compartment soft  Assessment/Plan:  1 Day Post-Op Procedure(s) (LRB): TOTAL KNEE ARTHROPLASTY (Right) Advance diet Up with therapy D/C IV fluids Discharge home with home health Follow-up in office 2 weeks postop. Continue on 325 mg of aspirin twice a day for 2 weeks for DVT prevention.   Ginger Organ Dezarai Prew 01/17/2019, 8:29 AM

## 2019-01-17 NOTE — TOC Progression Note (Addendum)
Transition of Care [facility]) - Progression Note    Patient Details  Name: Brett Buchanan MRN: [id number] Date of Birth: Dec 25, 1955  Transition of Care [facility]) CM/SW Contact  Golda Acre, RN Phone Number: 01/17/2019, 10:39 AM  Clinical Narrative:    tct-mdo-Michelle Eckland for name of workers comp. CSW/message left to please call back with information. tct -[phone] message to please return call. 1530-tct-Anna sales-informed over jkphone that patient has been dc and has lefty please call patient at home with hhc provider and to call back to myself with the same. Expected Discharge Plan: Home w Home Health Services Barriers to Discharge: Insurance Authorization  Expected Discharge Plan and Services Expected Discharge Plan: Home w Home Health Services Discharge Planning Services: CM Consult Post Acute Care Choice: Home Health Living arrangements for the past 2 months: Single Family Home Expected Discharge Date: 01/17/19                   HH Arranged: PT     Social Determinants of Health (SDOH) Interventions    Readmission Risk Interventions  No flowsheet data found.

## 2019-01-17 NOTE — Progress Notes (Signed)
Physical Therapy Treatment Patient Details Name: Brett Buchanan MRN: [id number] DOB: [birth year] Today's Date: 01/17/2019    History of Present Illness s/p R TKA    PT Comments    Progressing with mobility. Limited knee ROM 2* pain. Reviewed/practiced exercises, gait training, and stair training. All education completed-made RN aware.    Follow Up Recommendations  Follow surgeon's recommendation for DC plan and follow-up therapies     Equipment Recommendations  None recommended by PT    Recommendations for Other Services       Precautions / Restrictions Precautions Precautions: Knee Restrictions Weight Bearing Restrictions: No Other Position/Activity Restrictions: WBAT    Mobility  Bed Mobility               General bed mobility comments: oob in recliner  Transfers Overall transfer level: Needs assistance Equipment used: Rolling walker (2 wheeled) Transfers: Sit to/from Stand Sit to Stand: Min guard         General transfer comment: close guard for safety.   Ambulation/Gait Ambulation/Gait assistance: Min guard Gait Distance (Feet): 75 Feet Assistive device: Rolling walker (2 wheeled) Gait Pattern/deviations: Step-to pattern     General Gait Details: Cues for pt to try to step onto flat foot instead of staying on toes/forefoot. Close guard for safety.    Stairs Stairs: Yes Stairs assistance: Min guard Stair Management: Step to pattern;Forwards;One rail Left;With cane Number of Stairs: 2 General stair comments: up and over portable stairs. VCs safety, technique, sequence. Close guard for safety.    Wheelchair Mobility    Modified Rankin (Stroke Patients Only)       Balance Overall balance assessment: Mild deficits observed, not formally tested                                          Cognition Arousal/Alertness: Awake/alert Behavior During Therapy: WFL for tasks assessed/performed Overall Cognitive Status: Within  Functional Limits for tasks assessed                                        Exercises Total Joint Exercises Ankle Circles/Pumps: AROM;10 reps;Both;Seated Quad Sets: AROM;Both;10 reps;Seated Hip ABduction/ADduction: AAROM;Right;10 reps;Supine Straight Leg Raises: AAROM;Right;10 reps;Supine Knee Flexion: AAROM;Right;10 reps;Seated Goniometric ROM: ~10-50 degrees    General Comments        Pertinent Vitals/Pain Pain Assessment: 0-10 Pain Score: 7  Pain Location: right knee Pain Descriptors / Indicators: Sore;Aching;Sharp;Grimacing Pain Intervention(s): Limited activity within patient's tolerance;Repositioned;Ice applied    Home Living                      Prior Function            PT Goals (current goals can now be found in the care plan section) Progress towards PT goals: Progressing toward goals    Frequency    7X/week      PT Plan Current plan remains appropriate    Co-evaluation              AM-PAC PT "6 Clicks" Mobility   Outcome Measure  Help needed turning from your back to your side while in a flat bed without using bedrails?: A Little Help needed moving from lying on your back to sitting on the side of a flat bed without using bedrails?: A  Little Help needed moving to and from a bed to a chair (including a wheelchair)?: A Little Help needed standing up from a chair using your arms (e.g., wheelchair or bedside chair)?: A Little Help needed to walk in hospital room?: A Little Help needed climbing 3-5 steps with a railing? : A Little 6 Click Score: 18    End of Session Equipment Utilized During Treatment: Gait belt Activity Tolerance: Patient tolerated treatment well Patient left: in chair;with call bell/phone within reach   PT Visit Diagnosis: Difficulty in walking, not elsewhere classified (R26.2)     Time: 0940-1003 PT Time Calculation (min) (ACUTE ONLY): 23 min  Charges:  $Gait Training: 8-22 mins $Therapeutic  Exercise: 8-22 mins                        Rebeca Alert, PT Acute Rehabilitation Services Pager: [phone] Office: [phone]

## 2019-01-17 NOTE — Anesthesia Postprocedure Evaluation (Signed)
Anesthesia Post Note  Patient: Brett Buchanan  Procedure(s) Performed: TOTAL KNEE ARTHROPLASTY (Right Knee)     Patient location during evaluation: PACU Anesthesia Type: Regional and Spinal Level of consciousness: oriented and awake and alert Pain management: pain level controlled Vital Signs Assessment: post-procedure vital signs reviewed and stable Respiratory status: spontaneous breathing, respiratory function stable and patient connected to nasal cannula oxygen Cardiovascular status: blood pressure returned to baseline and stable Postop Assessment: no headache, no backache and no apparent nausea or vomiting Anesthetic complications: no    Last Vitals:  Vitals:   01/17/19 0610 01/17/19 0929  BP: 118/85 138/86  Pulse: 87 91  Resp: 14 14  Temp: 36.8 C 36.5 C  SpO2: 100% 100%    Last Pain:  Vitals:   01/17/19 1018  TempSrc:   PainSc: 4    Pain Goal: Patients Stated Pain Goal: 2 (01/17/19 1018)                 Doniesha Landau L Sameka Bagent

## 2019-01-17 NOTE — Progress Notes (Signed)
Reviewed d/c instructions with patient, verbalized understanding. Patient awaiting transportation to home. All belongings and instructions with patient.

## 2019-01-17 NOTE — TOC Initial Note (Signed)
Transition of Care [facility]) - Initial/Assessment Note    Patient Details  Name: Brett Buchanan MRN: [id number] Date of Birth: 13-Dec-1955  [facility]) CM/SW Contact:    Golda Acre, RN Phone Number: 01/17/2019, 10:39 AM  Clinical Narrative:                 Right total knee under workers compensation  Expected Discharge Plan: Home w Home Health Services Barriers to Discharge: Insurance Authorization   Patient Goals and CMS Choice Patient states their goals for this hospitalization and ongoing recovery are:: just to go home      Expected Discharge Plan and Services Expected Discharge Plan: Home w Home Health Services Discharge Planning Services: CM Consult Post Acute Care Choice: Home Health Living arrangements for the past 2 months: Single Family Home Expected Discharge Date: 01/17/19                   [facility] Arranged: PT    Prior Living Arrangements/Services Living arrangements for the past 2 months: Single Family Home Lives with:: Spouse Patient language and need for interpreter reviewed:: No        Need for Family Participation in Patient Care: Yes (Comment) Care giver support system in place?: Yes (comment)   Criminal Activity/Legal Involvement Pertinent to Current Situation/Hospitalization: No - Comment as needed  Activities of Daily Living Home Assistive Devices/Equipment: Environmental consultant (specify type), Crutches, Cane (specify quad or straight), Shower chair with back, Raised toilet seat with rails, Hand-held shower hose, Eyeglasses ADL Screening (condition at time of admission) Patient's cognitive ability adequate to safely complete daily activities?: Yes Is the patient deaf or have difficulty hearing?: No Does the patient have difficulty seeing, even when wearing glasses/contacts?: No Does the patient have difficulty concentrating, remembering, or making decisions?: No Patient able to express need for assistance with ADLs?: Yes Does the patient have  difficulty dressing or bathing?: No Independently performs ADLs?: Yes (appropriate for developmental age) Does the patient have difficulty walking or climbing stairs?: Yes Weakness of Legs: Right Weakness of Arms/Hands: None  Permission Sought/Granted Permission sought to share information with : Case Manager Permission granted to share information with : Yes, Verbal Permission Granted  Share Information with NAME: michelle eckland -mdo           Emotional Assessment Appearance:: Appears stated age   Affect (typically observed): Accepting, Calm Orientation: : Oriented to Self, Oriented to Place, Oriented to  Time, Oriented to Situation Alcohol / Substance Use: Tobacco Use, Alcohol Use Psych Involvement: No (comment)  Admission diagnosis:  Primary osteoarthritis of right knee [M17.11] Patient Active Problem List   Diagnosis Date Noted  . Primary localized osteoarthritis of right knee 01/16/2019  . Primary osteoarthritis of right knee 01/16/2019   PCP:  Robert Bellow, MD Pharmacy:   [facility] [phone] - [place], [place] - [address] [place] [address] [place] [address] [place] [address] Phone: [phone] Fax: [phone]     Social Determinants of Health (SDOH) Interventions    Readmission Risk Interventions  No flowsheet data found.

## 2019-01-18 NOTE — TOC Transition Note (Signed)
Transition of Care [facility]) - CM/SW Discharge Note   Patient Details  Name: Brett Buchanan MRN: [id number] Date of Birth: 09/04/1956  Transition of Care [facility]) CM/SW Contact:  Brett Acre, RN Phone Number: 01/18/2019, 12:15 PM   Clinical Narrative:    Home ncare called concerning home pt requested info faxed to Brett Buchanan [phone]/TJ TO START TODAY   Final next level of care: Home w Home Health Services Barriers to Discharge: Insurance Authorization   Patient Goals and CMS Choice Patient states their goals for this hospitalization and ongoing recovery are:: just to go home      Discharge Placement                       Discharge Plan and Services   Discharge Planning Services: CM Consult Post Acute Care Choice: Home Health              HH Arranged: PT     Social Determinants of Health (SDOH) Interventions     Readmission Risk Interventions No flowsheet data found.

## 2019-02-21 ENCOUNTER — Other Ambulatory Visit: Payer: Self-pay | Admitting: Orthopaedic Surgery

## 2019-02-22 ENCOUNTER — Other Ambulatory Visit: Payer: Self-pay

## 2019-02-22 ENCOUNTER — Encounter (HOSPITAL_BASED_OUTPATIENT_CLINIC_OR_DEPARTMENT_OTHER): Payer: Self-pay | Admitting: *Deleted

## 2019-02-26 ENCOUNTER — Encounter (HOSPITAL_BASED_OUTPATIENT_CLINIC_OR_DEPARTMENT_OTHER)
Admission: RE | Admit: 2019-02-26 | Discharge: 2019-02-26 | Disposition: A | Discharge status: Home / Self Care | Payer: PRIVATE HEALTH INSURANCE | Source: Ambulatory Visit | Attending: Orthopaedic Surgery | Admitting: Orthopaedic Surgery

## 2019-02-26 DIAGNOSIS — Z01812 Encounter for preprocedural laboratory examination: Secondary | ICD-10-CM | POA: Diagnosis present

## 2019-02-26 DIAGNOSIS — M25661 Stiffness of right knee, not elsewhere classified: Secondary | ICD-10-CM | POA: Diagnosis not present

## 2019-02-26 DIAGNOSIS — X58XXXA Exposure to other specified factors, initial encounter: Secondary | ICD-10-CM | POA: Diagnosis not present

## 2019-02-26 DIAGNOSIS — I10 Essential (primary) hypertension: Secondary | ICD-10-CM | POA: Diagnosis not present

## 2019-02-26 DIAGNOSIS — M1990 Unspecified osteoarthritis, unspecified site: Secondary | ICD-10-CM | POA: Diagnosis present

## 2019-02-26 DIAGNOSIS — T8489XA Other specified complication of internal orthopedic prosthetic devices, implants and grafts, initial encounter: Secondary | ICD-10-CM | POA: Diagnosis not present

## 2019-02-26 DIAGNOSIS — K219 Gastro-esophageal reflux disease without esophagitis: Secondary | ICD-10-CM | POA: Diagnosis not present

## 2019-02-26 LAB — BASIC METABOLIC PANEL
Anion gap: 11 (ref 5–15)
BUN: <5 mg/dL — ABNORMAL LOW (ref 8–23)
CO2: 25 mmol/L (ref 22–32)
Calcium: 9.7 mg/dL (ref 8.9–10.3)
Chloride: 103 mmol/L (ref 98–111)
Creatinine, Ser: 0.81 mg/dL (ref 0.61–1.24)
GFR calc Af Amer: >60 mL/min (ref >60)
GFR calc non Af Amer: >60 mL/min (ref >60)
Glucose, Bld: 108 mg/dL — ABNORMAL HIGH (ref 70–99)
Potassium: 4.3 mmol/L (ref 3.5–5.1)
Sodium: 139 mmol/L (ref 135–145)

## 2019-02-26 NOTE — H&P (Signed)
Brett Buchanan is an 63 y.o. male.   Chief Complaint: Right knee pain and stiffness HPI: Brett Buchanan is about 5 weeks from his knee replacement.  It remains fairly stiff despite outpatient physical therapy.  He continues on one crutch.  He is off pain medicine.  Radiographs:  X-rays that were ordered, performed, and interpreted by me today included 2 views of the right knee.  These clearly show good placement of the components and spacer.  Past Medical History:  Diagnosis Date  . Arthritis   . GERD (gastroesophageal reflux disease)   . Hypertension     Past Surgical History:  Procedure Laterality Date  . REPAIR ANKLE LIGAMENT     1975  . TOTAL KNEE ARTHROPLASTY Right 01/16/2019   Procedure: TOTAL KNEE ARTHROPLASTY;  Surgeon: Marcene Corning, MD;  Location: [facility];  Service: Orthopedics;  Laterality: Right;    History reviewed. No pertinent family history. Social History:  reports that he has never smoked. He has never used smokeless tobacco. He reports previous alcohol use. He reports that he does not use drugs.  Allergies: No Known Allergies  No medications prior to admission.    No results found for this or any previous visit (from the past 48 hour(s)). No results found.  Review of Systems  Musculoskeletal: Positive for joint pain.       Right knee  All other systems reviewed and are negative.   Height 5\' 3"  (1.6 m), weight 90.7 kg. Physical Exam  Constitutional: He is oriented to person, place, and time. He appears well-developed and well-nourished.  HENT:  Head: Normocephalic and atraumatic.  Eyes: Pupils are equal, round, and reactive to light.  Neck: Normal range of motion.  Cardiovascular: Normal rate and regular rhythm.  Respiratory: Effort normal.  GI: Soft.  Musculoskeletal:     Comments: Right knee remains very stiff again with range of motion from about 10-30.  His incision is benign and there is no effusion.  Calf is soft and nontender.    Neurological: He is  alert and oriented to person, place, and time.  Skin: Skin is warm and dry.  Psychiatric: He has a normal mood and affect. His behavior is normal. Judgment and thought content normal.     Assessment/Plan Assessment:  Status post right TKR 01/16/19 with significant stiffness  Plan: I would like to put Brett Buchanan through manipulation at this point.  He is really not gaining any motion and only has 30 of flexion now 5 weeks out from his replacement.  I would consider this semi-emergent as if we wait another month it is going to be very difficult to get any more motion for him.  I reviewed risk of anesthesia and fracture.  I told him we would like to get this done in the next week and get him back into therapy at that time.   Brett Organ Bernadetta Roell, PA-C 02/26/2019, 9:07 AM

## 2019-02-26 NOTE — Progress Notes (Signed)
Ensure Pre-Surgery drink given to patient with instructions to complete by 0415 DOS.  Patient verbalized understanding of instructions. 

## 2019-02-27 ENCOUNTER — Other Ambulatory Visit: Payer: Self-pay

## 2019-02-27 ENCOUNTER — Encounter (HOSPITAL_BASED_OUTPATIENT_CLINIC_OR_DEPARTMENT_OTHER)
Admission: RE | Disposition: A | Discharge status: Home / Self Care | Payer: Self-pay | Source: Home / Self Care | Attending: Orthopaedic Surgery

## 2019-02-27 ENCOUNTER — Encounter (HOSPITAL_BASED_OUTPATIENT_CLINIC_OR_DEPARTMENT_OTHER): Payer: Self-pay | Admitting: *Deleted

## 2019-02-27 ENCOUNTER — Ambulatory Visit (HOSPITAL_BASED_OUTPATIENT_CLINIC_OR_DEPARTMENT_OTHER): Payer: No Typology Code available for payment source | Admitting: Certified Registered"

## 2019-02-27 ENCOUNTER — Ambulatory Visit (HOSPITAL_COMMUNITY)
Admission: RE | Admit: 2019-02-27 | Discharge: 2019-02-27 | Disposition: A | Discharge status: Home / Self Care | Payer: No Typology Code available for payment source | Attending: Orthopaedic Surgery | Admitting: Orthopaedic Surgery

## 2019-02-27 DIAGNOSIS — M25661 Stiffness of right knee, not elsewhere classified: Secondary | ICD-10-CM | POA: Diagnosis not present

## 2019-02-27 DIAGNOSIS — M1990 Unspecified osteoarthritis, unspecified site: Secondary | ICD-10-CM | POA: Diagnosis not present

## 2019-02-27 DIAGNOSIS — T8489XA Other specified complication of internal orthopedic prosthetic devices, implants and grafts, initial encounter: Secondary | ICD-10-CM | POA: Insufficient documentation

## 2019-02-27 DIAGNOSIS — I10 Essential (primary) hypertension: Secondary | ICD-10-CM | POA: Diagnosis not present

## 2019-02-27 DIAGNOSIS — X58XXXA Exposure to other specified factors, initial encounter: Secondary | ICD-10-CM | POA: Insufficient documentation

## 2019-02-27 DIAGNOSIS — K219 Gastro-esophageal reflux disease without esophagitis: Secondary | ICD-10-CM | POA: Insufficient documentation

## 2019-02-27 HISTORY — PX: KNEE CLOSED REDUCTION: SHX995

## 2019-02-27 SURGERY — MANIPULATION, KNEE, CLOSED
Anesthesia: General | Site: Knee | Laterality: Right

## 2019-02-27 MED ORDER — OXYCODONE HCL 5 MG PO TABS: 5.0000 mg | ORAL_TABLET | Freq: Once | ORAL | Status: DC | PRN

## 2019-02-27 MED ORDER — METHYLPREDNISOLONE ACETATE 40 MG/ML IJ SUSP
INTRAMUSCULAR | Status: AC
  Filled 2019-02-27: qty 1

## 2019-02-27 MED ORDER — BUPIVACAINE-EPINEPHRINE 0.5% -1:200000 IJ SOLN
INTRAMUSCULAR | Status: DC | PRN
  Administered 2019-02-27: 5 mL

## 2019-02-27 MED ORDER — FENTANYL CITRATE (PF) 100 MCG/2ML IJ SOLN
INTRAMUSCULAR | Status: AC
  Filled 2019-02-27: qty 2

## 2019-02-27 MED ORDER — PROMETHAZINE HCL 25 MG/ML IJ SOLN: 6.2500 mg | INTRAMUSCULAR | Status: DC | PRN

## 2019-02-27 MED ORDER — SCOPOLAMINE 1 MG/3DAYS TD PT72: 1.0000 | MEDICATED_PATCH | Freq: Once | TRANSDERMAL | Status: DC | PRN

## 2019-02-27 MED ORDER — METHYLPREDNISOLONE ACETATE 80 MG/ML IJ SUSP
INTRAMUSCULAR | Status: AC
  Filled 2019-02-27: qty 1

## 2019-02-27 MED ORDER — LIDOCAINE HCL (CARDIAC) PF 100 MG/5ML IV SOSY
PREFILLED_SYRINGE | INTRAVENOUS | Status: DC | PRN
  Administered 2019-02-27: 100 mg via INTRAVENOUS

## 2019-02-27 MED ORDER — CEFAZOLIN SODIUM-DEXTROSE 2-4 GM/100ML-% IV SOLN
INTRAVENOUS | Status: AC
  Filled 2019-02-27: qty 100

## 2019-02-27 MED ORDER — DEXAMETHASONE SODIUM PHOSPHATE 10 MG/ML IJ SOLN
INTRAMUSCULAR | Status: DC | PRN
  Administered 2019-02-27: 10 mg via INTRAVENOUS

## 2019-02-27 MED ORDER — LACTATED RINGERS IV SOLN: INTRAVENOUS | Status: DC

## 2019-02-27 MED ORDER — LIDOCAINE 2% (20 MG/ML) 5 ML SYRINGE
INTRAMUSCULAR | Status: AC
  Filled 2019-02-27: qty 5

## 2019-02-27 MED ORDER — PROPOFOL 10 MG/ML IV BOLUS
INTRAVENOUS | Status: DC | PRN
  Administered 2019-02-27: 200 mg via INTRAVENOUS

## 2019-02-27 MED ORDER — HYDROMORPHONE HCL 1 MG/ML IJ SOLN
0.2500 mg | INTRAMUSCULAR | Status: DC | PRN
  Administered 2019-02-27 (×2): 0.5 mg via INTRAVENOUS

## 2019-02-27 MED ORDER — BUPIVACAINE-EPINEPHRINE (PF) 0.5% -1:200000 IJ SOLN
INTRAMUSCULAR | Status: AC
  Filled 2019-02-27: qty 30

## 2019-02-27 MED ORDER — GLYCOPYRROLATE PF 0.2 MG/ML IJ SOSY
PREFILLED_SYRINGE | INTRAMUSCULAR | Status: AC
  Filled 2019-02-27: qty 1

## 2019-02-27 MED ORDER — FENTANYL CITRATE (PF) 100 MCG/2ML IJ SOLN
50.0000 ug | INTRAMUSCULAR | Status: AC | PRN
  Administered 2019-02-27 (×2): 25 ug via INTRAVENOUS
  Administered 2019-02-27: 50 ug via INTRAVENOUS

## 2019-02-27 MED ORDER — OXYCODONE HCL 5 MG/5ML PO SOLN: 5.0000 mg | Freq: Once | ORAL | Status: DC | PRN

## 2019-02-27 MED ORDER — CEFAZOLIN SODIUM-DEXTROSE 2-3 GM-%(50ML) IV SOLR
INTRAVENOUS | Status: DC | PRN
  Administered 2019-02-27: 2 g via INTRAVENOUS

## 2019-02-27 MED ORDER — MIDAZOLAM HCL 2 MG/2ML IJ SOLN
1.0000 mg | INTRAMUSCULAR | Status: DC | PRN
  Administered 2019-02-27: 2 mg via INTRAVENOUS

## 2019-02-27 MED ORDER — CEFAZOLIN SODIUM-DEXTROSE 2-4 GM/100ML-% IV SOLN: 2.0000 g | INTRAVENOUS | Status: DC

## 2019-02-27 MED ORDER — HYDROMORPHONE HCL 1 MG/ML IJ SOLN
INTRAMUSCULAR | Status: AC
  Filled 2019-02-27: qty 0.5

## 2019-02-27 MED ORDER — PROPOFOL 500 MG/50ML IV EMUL
INTRAVENOUS | Status: AC
  Filled 2019-02-27: qty 50

## 2019-02-27 MED ORDER — LACTATED RINGERS IV SOLN
INTRAVENOUS | Status: DC
  Administered 2019-02-27: 07:00:00 via INTRAVENOUS

## 2019-02-27 MED ORDER — ONDANSETRON HCL 4 MG/2ML IJ SOLN
INTRAMUSCULAR | Status: AC
  Filled 2019-02-27: qty 2

## 2019-02-27 MED ORDER — CHLORHEXIDINE GLUCONATE 4 % EX LIQD: 60.0000 mL | Freq: Once | CUTANEOUS | Status: DC

## 2019-02-27 MED ORDER — ONDANSETRON HCL 4 MG/2ML IJ SOLN
INTRAMUSCULAR | Status: DC | PRN
  Administered 2019-02-27: 4 mg via INTRAVENOUS

## 2019-02-27 MED ORDER — OXYCODONE-ACETAMINOPHEN 5-325 MG PO TABS: 1.0000 | ORAL_TABLET | Freq: Four times a day (QID) | ORAL | 0 refills | Status: AC | PRN

## 2019-02-27 MED ORDER — PROPOFOL 10 MG/ML IV BOLUS
INTRAVENOUS | Status: AC
  Filled 2019-02-27: qty 40

## 2019-02-27 MED ORDER — MIDAZOLAM HCL 2 MG/2ML IJ SOLN
INTRAMUSCULAR | Status: AC
  Filled 2019-02-27: qty 2

## 2019-02-27 MED ORDER — DEXAMETHASONE SODIUM PHOSPHATE 10 MG/ML IJ SOLN
INTRAMUSCULAR | Status: AC
  Filled 2019-02-27: qty 1

## 2019-02-27 SURGICAL SUPPLY — 8 items
BANDAGE ADH SHEER 1  50/CT (GAUZE/BANDAGES/DRESSINGS) ×3 IMPLANT
GLOVE BIO SURGEON STRL SZ8 (GLOVE) IMPLANT
NDL SAFETY ECLIPSE 18X1.5 (NEEDLE) IMPLANT
NEEDLE HYPO 18GX1.5 SHARP (NEEDLE)
NEEDLE HYPO 22GX1.5 SAFETY (NEEDLE) ×3 IMPLANT
PAD ALCOHOL SWAB (MISCELLANEOUS) ×6 IMPLANT
SYR 20CC LL (SYRINGE) IMPLANT
SYR CONTROL 10ML LL (SYRINGE) ×3 IMPLANT

## 2019-02-27 NOTE — Discharge Instructions (Signed)

## 2019-02-27 NOTE — Op Note (Signed)
Brett Buchanan [id number] 02/27/2019   PRE-OP DIAGNOSIS: stiff right TKR  POST-OP DIAGNOSIS: same  PROCEDURE: right knee manip  ANESTHESIA: general  [name]   Dictation #:  [id number]

## 2019-02-27 NOTE — Interval H&P Note (Signed)
History and Physical Interval Note:  02/27/2019 8:15 AM  Brett Buchanan  has presented today for surgery, with the diagnosis of Right Knee stiff total knee replacement.  The various methods of treatment have been discussed with the patient and family. After consideration of risks, benefits and other options for treatment, the patient has consented to  Procedure(s): Right Knee Manipulation (Right) as a surgical intervention.  The patient's history has been reviewed, patient examined, no change in status, stable for surgery.  I have reviewed the patient's chart and labs.  Questions were answered to the patient's satisfaction.     [name]

## 2019-02-27 NOTE — Op Note (Signed)
NAMEKINGSTEN, BAYANI MEDICAL RECORD YT:[id number] ACCOUNT [id number] DATE OF BIRTH:1956-08-14 FACILITY: [facility] LOCATION: [facility] PHYSICIAN:Humberto Addo Reece Agar Paxton Binns, MD  OPERATIVE REPORT  DATE OF PROCEDURE:  02/27/2019  PREOPERATIVE DIAGNOSIS:  Stiff right total knee replacement.  POSTOPERATIVE DIAGNOSIS:  Stiff right total knee replacement.  PROCEDURE:  Right knee closed manipulation.  ANESTHESIA:  General.  ATTENDING SURGEON:  Marcene Corning, MD  INDICATIONS:  The patient is a 63 year old man who is about 6 weeks from a right knee replacement.  He has persisted with significant stiffness despite aggressive physical therapy.  At this point, he is offered a manipulation.  Informed operative consent  was obtained after discussion of possible complications including reaction to anesthesia, infection, and continued stiffness.  SUMMARY OF FINDINGS AND PROCEDURE:  Under general anesthetic, a right knee manipulation was performed.  We improved his range significantly.  He was injected with some Marcaine at the end of the case sterilely.  DESCRIPTION OF PROCEDURE:  The patient was taken to the operating suite where general anesthetic was applied without difficulty.  He was positioned supine.  After an appropriate timeout, we performed a manipulation.  His motion asleep was really limited  at about a range of 10 to maybe 25 degrees.  We were able to gradually increase this to arrange for about 5-95 degrees.  We did have some audible pops along the way.  I then sterilely prepped the region superolateral and injected the joint with 5 mL of  Marcaine with epinephrine.  A Band-Aid was applied.  Estimated blood loss zero.  Intraoperative fluids can be obtained from the anesthesia records.  No tourniquet was used.  The patient was taken to recovery room in stable condition.  He wants to go home  the same day and follow up in the office in a week or so.  He will resume physical therapy tomorrow.  LN/NUANCE   D:02/27/2019 T:02/27/2019 JOB:[id number]

## 2019-02-27 NOTE — Transfer of Care (Signed)
Immediate Anesthesia Transfer of Care Note  Patient: Brett Buchanan  Procedure(s) Performed: Right Knee Manipulation (Right Knee)  Patient Location: PACU  Anesthesia Type:General  Level of Consciousness: awake, alert  and oriented  Airway & Oxygen Therapy: Patient Spontanous Breathing and Patient connected to face mask oxygen  Post-op Assessment: Report given to RN and Post -op Vital signs reviewed and stable  Post vital signs: Reviewed and stable  Last Vitals:  Vitals Value Taken Time  BP    Temp    Pulse    Resp    SpO2      Last Pain:  Vitals:   02/27/19 0637  TempSrc: Oral  PainSc: 4       Patients Stated Pain Goal: 4 (02/27/19 2035)  Complications: No apparent anesthesia complications

## 2019-02-27 NOTE — Anesthesia Postprocedure Evaluation (Signed)
Anesthesia Post Note  Patient: Brett Buchanan  Procedure(s) Performed: Right Knee Manipulation (Right Knee)     Patient location during evaluation: PACU Anesthesia Type: General Level of consciousness: awake and alert Pain management: pain level controlled Vital Signs Assessment: post-procedure vital signs reviewed and stable Respiratory status: spontaneous breathing, nonlabored ventilation and respiratory function stable Cardiovascular status: blood pressure returned to baseline and stable Postop Assessment: no apparent nausea or vomiting Anesthetic complications: no    Last Vitals:  Vitals:   02/27/19 0930 02/27/19 0948  BP:  119/87  Pulse: (!) 101 (!) 102  Resp: (!) 22 20  Temp:  36.9 C  SpO2: 95% 98%    Last Pain:  Vitals:   02/27/19 0948  TempSrc:   PainSc: 5                  Lowella Curb

## 2019-02-27 NOTE — Anesthesia Preprocedure Evaluation (Signed)
Anesthesia Evaluation  Patient identified by MRN, date of birth, ID band Patient awake    Reviewed: Allergy & Precautions, NPO status , Patient's Chart, lab work & pertinent test results  Airway Mallampati: I  TM Distance: >3 FB Neck ROM: Full    Dental no notable dental hx. (+) Teeth Intact, Dental Advisory Given   Pulmonary neg pulmonary ROS,    Pulmonary exam normal breath sounds clear to auscultation       Cardiovascular hypertension, Pt. on medications negative cardio ROS Normal cardiovascular exam Rhythm:Regular Rate:Normal     Neuro/Psych negative neurological ROS  negative psych ROS   GI/Hepatic Neg liver ROS, GERD  Medicated,  Endo/Other  negative endocrine ROS  Renal/GU negative Renal ROS  negative genitourinary   Musculoskeletal  (+) Arthritis , Osteoarthritis,    Abdominal   Peds  Hematology negative hematology ROS (+)   Anesthesia Other Findings   Reproductive/Obstetrics                             Anesthesia Physical  Anesthesia Plan  ASA: II  Anesthesia Plan: General   Post-op Pain Management:    Induction: Intravenous  PONV Risk Score and Plan: 2 and Treatment may vary due to age or medical condition, Midazolam and Ondansetron  Airway Management Planned: Mask  Additional Equipment:   Intra-op Plan:   Post-operative Plan:   Informed Consent: I have reviewed the patients History and Physical, chart, labs and discussed the procedure including the risks, benefits and alternatives for the proposed anesthesia with the patient or authorized representative who has indicated his/her understanding and acceptance.     Dental advisory given  Plan Discussed with: CRNA  Anesthesia Plan Comments:         Anesthesia Quick Evaluation

## 2019-02-28 ENCOUNTER — Encounter (HOSPITAL_BASED_OUTPATIENT_CLINIC_OR_DEPARTMENT_OTHER): Payer: Self-pay | Admitting: Orthopaedic Surgery

## 2020-01-28 IMAGING — DX CHEST - 2 VIEW
2 series · 2 of 2 positions shown · non-contrast
Comparison: None.

CLINICAL DATA: Preoperative for knee surgery

EXAM:
CHEST - 2 VIEW

[chest pa]
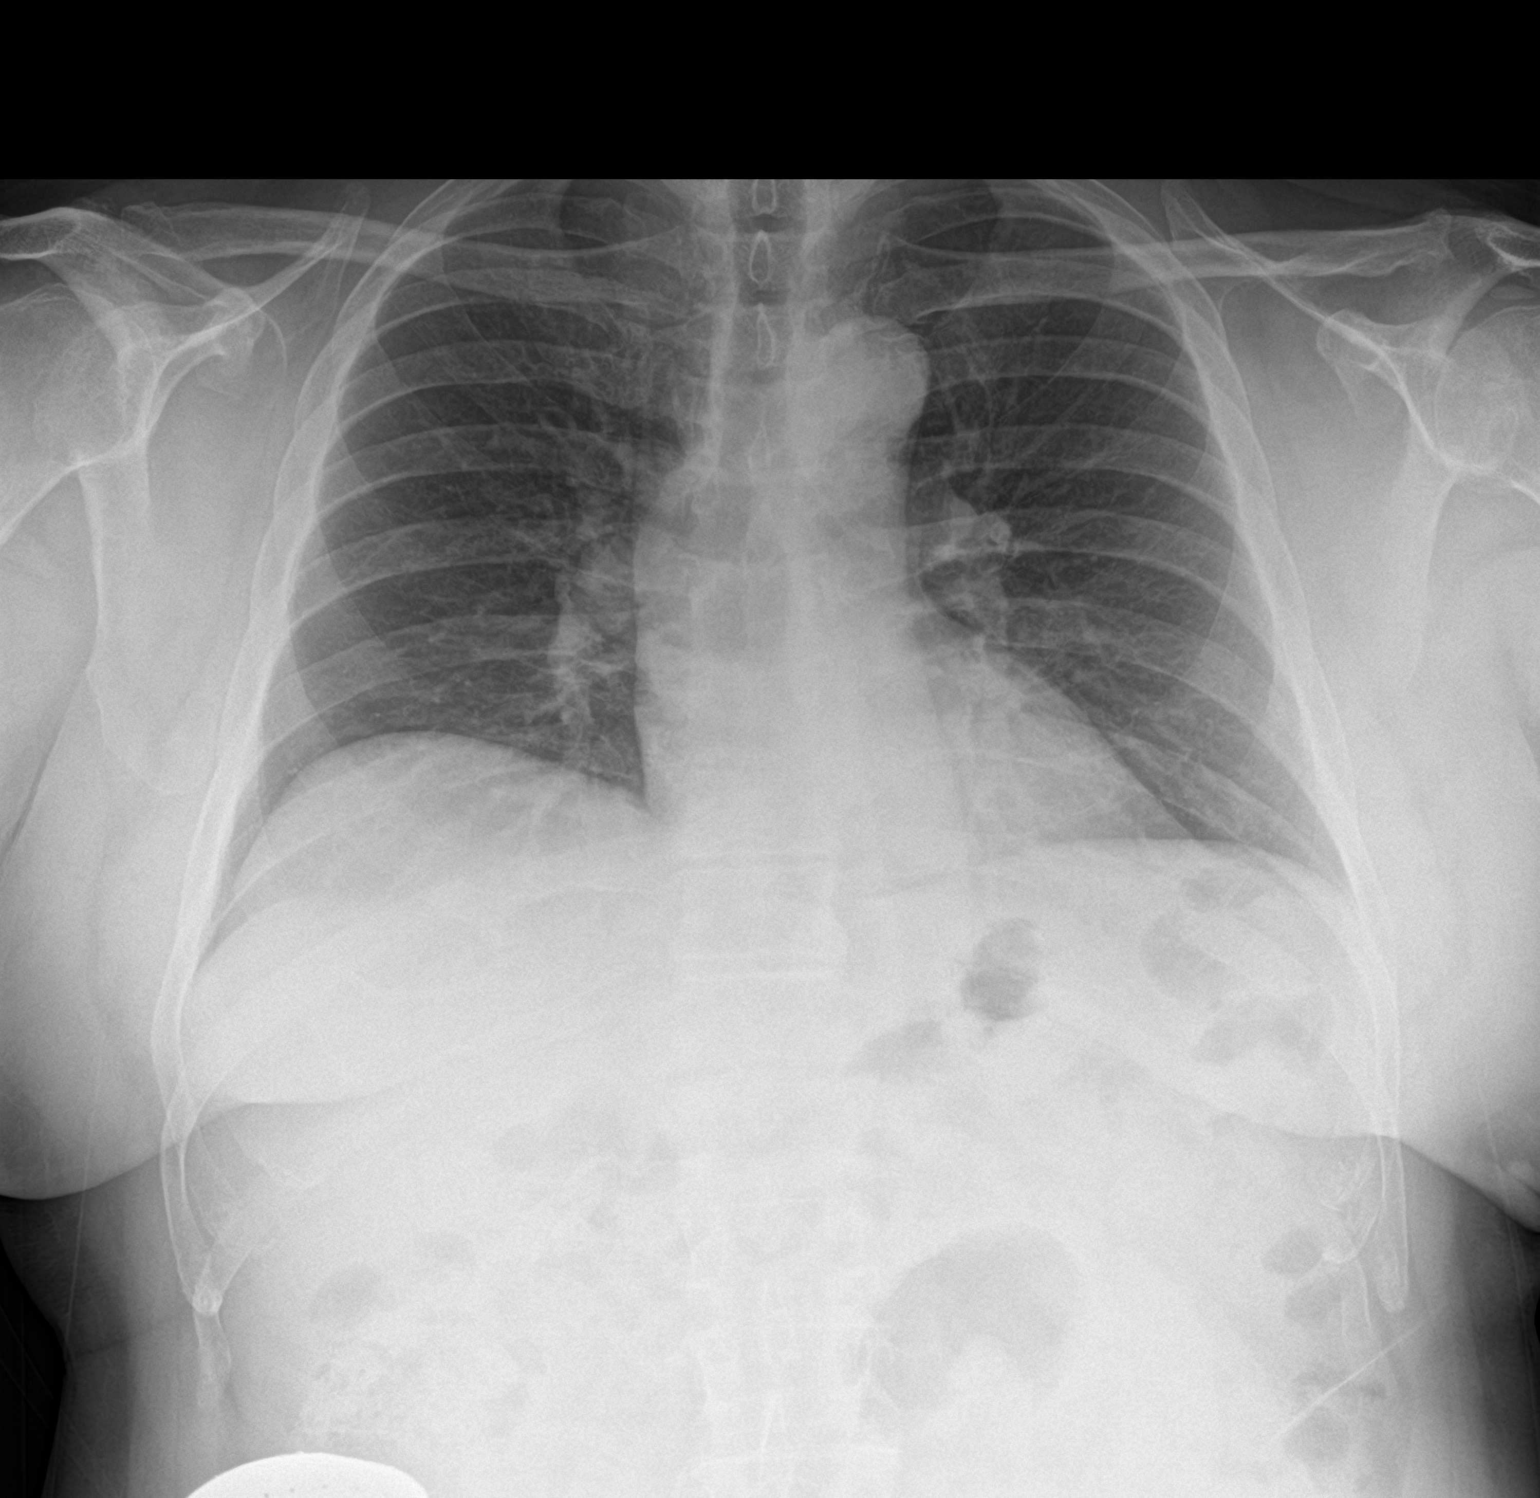

[chest lat]
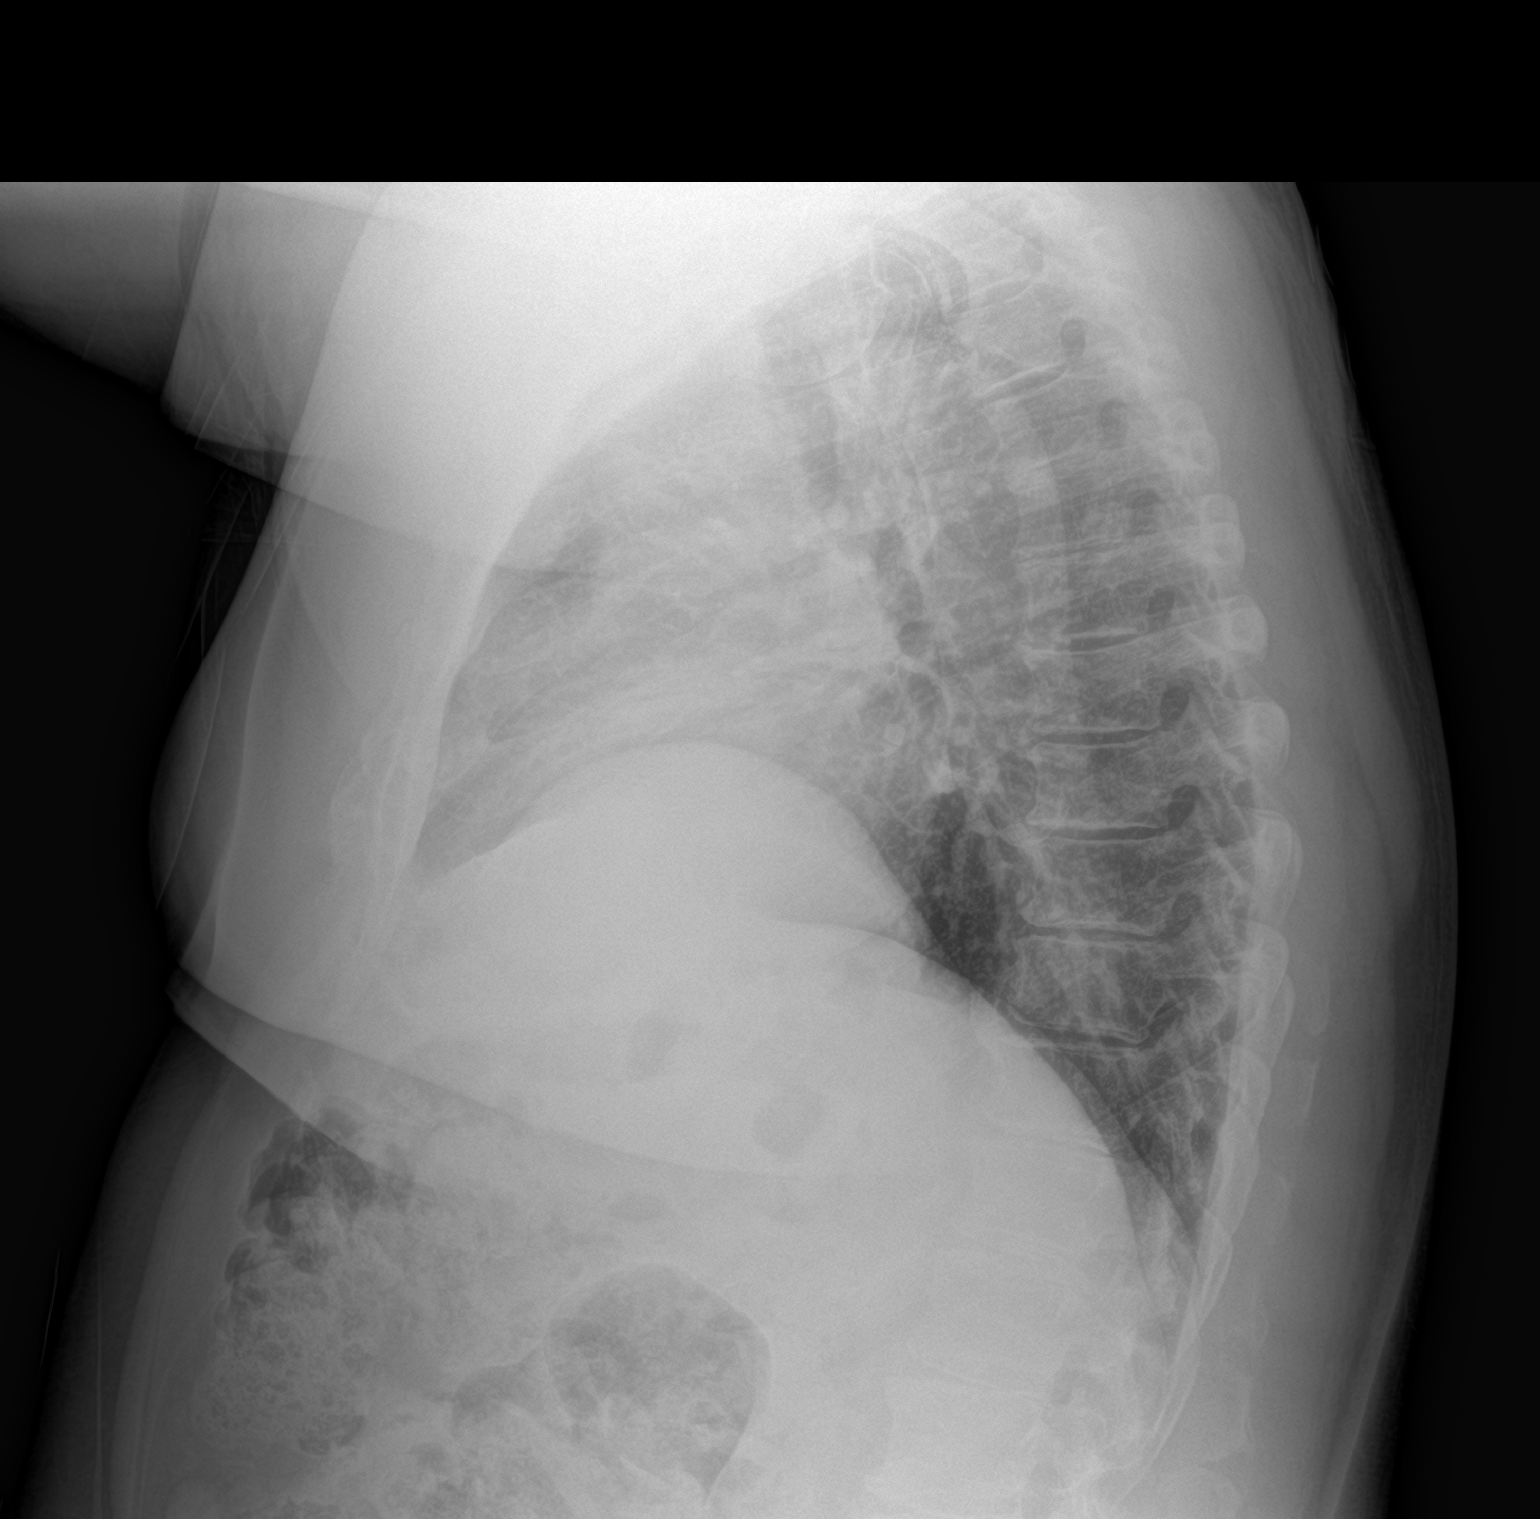

[2 of 2 positions shown; findings below may reference images not displayed]

FINDINGS: Normal heart size. Normal mediastinal contour. No pneumothorax. No
pleural effusion. Lungs appear clear, with no acute consolidative
airspace disease and no pulmonary edema.
IMPRESSION: No active cardiopulmonary disease.
